# Patient Record
Sex: Female | Born: 1945 | Race: White | Hispanic: No | Marital: Married | Smoking: Never smoker
Health system: Southern US, Community
[De-identification: ages and names within clinical notes are randomized; demographics above are authoritative.]

## PROBLEM LIST (undated history)

## (undated) DIAGNOSIS — I471 Supraventricular tachycardia: Secondary | ICD-10-CM

## (undated) DIAGNOSIS — R002 Palpitations: Secondary | ICD-10-CM

## (undated) DIAGNOSIS — M25569 Pain in unspecified knee: Secondary | ICD-10-CM

## (undated) DIAGNOSIS — U071 COVID-19: Secondary | ICD-10-CM

## (undated) DIAGNOSIS — J309 Allergic rhinitis, unspecified: Secondary | ICD-10-CM

## (undated) DIAGNOSIS — R079 Chest pain, unspecified: Secondary | ICD-10-CM

## (undated) DIAGNOSIS — S42201A Unspecified fracture of upper end of right humerus, initial encounter for closed fracture: Secondary | ICD-10-CM

## (undated) DIAGNOSIS — E785 Hyperlipidemia, unspecified: Secondary | ICD-10-CM

## (undated) DIAGNOSIS — I493 Ventricular premature depolarization: Secondary | ICD-10-CM

## (undated) DIAGNOSIS — I4719 Other supraventricular tachycardia: Secondary | ICD-10-CM

## (undated) DIAGNOSIS — F418 Other specified anxiety disorders: Secondary | ICD-10-CM

## (undated) DIAGNOSIS — G43909 Migraine, unspecified, not intractable, without status migrainosus: Secondary | ICD-10-CM

## (undated) DIAGNOSIS — B353 Tinea pedis: Secondary | ICD-10-CM

## (undated) DIAGNOSIS — M8580 Other specified disorders of bone density and structure, unspecified site: Secondary | ICD-10-CM

## (undated) DIAGNOSIS — I2510 Atherosclerotic heart disease of native coronary artery without angina pectoris: Secondary | ICD-10-CM

## (undated) HISTORY — DX: Other specified disorders of bone density and structure, unspecified site: M85.80

## (undated) HISTORY — PX: OTHER SURGICAL HISTORY: SHX169

## (undated) HISTORY — PX: HAND SURGERY: SHX662

## (undated) HISTORY — DX: Chest pain, unspecified: R07.9

## (undated) HISTORY — DX: Atherosclerotic heart disease of native coronary artery without angina pectoris: I25.10

## (undated) HISTORY — DX: Tinea pedis: B35.3

## (undated) HISTORY — DX: Allergic rhinitis, unspecified: J30.9

## (undated) HISTORY — DX: Supraventricular tachycardia: I47.1

## (undated) HISTORY — DX: Ventricular premature depolarization: I49.3

## (undated) HISTORY — PX: ABDOMINAL HYSTERECTOMY: SHX81

## (undated) HISTORY — DX: COVID-19: U07.1

## (undated) HISTORY — DX: Palpitations: R00.2

## (undated) HISTORY — DX: Pain in unspecified knee: M25.569

## (undated) HISTORY — DX: Other specified anxiety disorders: F41.8

## (undated) HISTORY — PX: CARPAL TUNNEL WITH CUBITAL TUNNEL: SHX5608

## (undated) HISTORY — DX: Other supraventricular tachycardia: I47.19

## (undated) HISTORY — DX: Hyperlipidemia, unspecified: E78.5

## (undated) HISTORY — DX: Migraine, unspecified, not intractable, without status migrainosus: G43.909

---

## 2002-05-03 ENCOUNTER — Ambulatory Visit (HOSPITAL_BASED_OUTPATIENT_CLINIC_OR_DEPARTMENT_OTHER): Admission: RE | Admit: 2002-05-03 | Discharge: 2002-05-03 | Payer: Self-pay | Admitting: Orthopedic Surgery

## 2003-06-28 ENCOUNTER — Ambulatory Visit (HOSPITAL_COMMUNITY): Admission: RE | Admit: 2003-06-28 | Discharge: 2003-06-28 | Payer: Self-pay | Admitting: Internal Medicine

## 2003-06-28 ENCOUNTER — Encounter: Payer: Self-pay | Admitting: Internal Medicine

## 2004-07-04 ENCOUNTER — Ambulatory Visit (HOSPITAL_COMMUNITY): Admission: RE | Admit: 2004-07-04 | Discharge: 2004-07-04 | Payer: Self-pay | Admitting: Internal Medicine

## 2007-04-06 ENCOUNTER — Ambulatory Visit (HOSPITAL_COMMUNITY): Admission: RE | Admit: 2007-04-06 | Discharge: 2007-04-06 | Payer: Self-pay | Admitting: Internal Medicine

## 2007-04-28 ENCOUNTER — Ambulatory Visit (HOSPITAL_COMMUNITY): Admission: RE | Admit: 2007-04-28 | Discharge: 2007-04-28 | Payer: Self-pay | Admitting: Internal Medicine

## 2007-04-29 ENCOUNTER — Ambulatory Visit (HOSPITAL_COMMUNITY): Admission: RE | Admit: 2007-04-29 | Discharge: 2007-04-29 | Payer: Self-pay | Admitting: Internal Medicine

## 2007-05-03 ENCOUNTER — Ambulatory Visit: Payer: Self-pay | Admitting: Cardiology

## 2007-05-27 ENCOUNTER — Ambulatory Visit: Payer: Self-pay | Admitting: Cardiology

## 2007-08-16 ENCOUNTER — Ambulatory Visit (HOSPITAL_COMMUNITY): Admission: RE | Admit: 2007-08-16 | Discharge: 2007-08-16 | Payer: Self-pay | Admitting: Internal Medicine

## 2009-11-22 ENCOUNTER — Ambulatory Visit (HOSPITAL_COMMUNITY): Admission: RE | Admit: 2009-11-22 | Discharge: 2009-11-22 | Payer: Self-pay | Admitting: Internal Medicine

## 2010-06-19 ENCOUNTER — Ambulatory Visit: Payer: Self-pay | Admitting: Vascular Surgery

## 2010-06-19 ENCOUNTER — Ambulatory Visit (HOSPITAL_COMMUNITY): Admission: RE | Admit: 2010-06-19 | Discharge: 2010-06-19 | Payer: Self-pay | Admitting: Orthopaedic Surgery

## 2010-10-10 ENCOUNTER — Ambulatory Visit (HOSPITAL_COMMUNITY)
Admission: RE | Admit: 2010-10-10 | Discharge: 2010-10-10 | Payer: Self-pay | Source: Home / Self Care | Attending: Internal Medicine | Admitting: Internal Medicine

## 2010-12-25 ENCOUNTER — Ambulatory Visit (HOSPITAL_COMMUNITY)
Admission: RE | Admit: 2010-12-25 | Discharge: 2010-12-25 | Disposition: A | Discharge status: Home / Self Care | Payer: 59 | Source: Ambulatory Visit | Attending: Gastroenterology | Admitting: Gastroenterology

## 2010-12-25 DIAGNOSIS — Z1211 Encounter for screening for malignant neoplasm of colon: Secondary | ICD-10-CM | POA: Insufficient documentation

## 2010-12-29 NOTE — Op Note (Signed)
  [name], [name] NO.:  [id number]  MEDICAL RECORD NO.:  [id number]           PATIENT TYPE:  O  LOCATION:  [facility]                         FACILITY:  [facility]  PHYSICIAN:  Danise Edge, M.D.   DATE OF BIRTH:  [birth year]  DATE OF PROCEDURE:  12/25/2010 DATE OF DISCHARGE:                              OPERATIVE REPORT   REFERRING PHYSICIAN:  Thora Lance, M.D.  PROCEDURE:  Screening colonoscopy.  HISTORY:  Ms. Cathy Tucker is a 65 year old female born on 1946-01-27. The patient is scheduled to undergo her second screening colonoscopy with polypectomy to prevent colon cancer.  The patient underwent a normal screening colonoscopy in 2003.  ENDOSCOPIST:  Danise Edge, M.D.  PREMEDICATION:  Propofol administered by Anesthesia.  PROCEDURE IN DETAIL:  After obtaining informed consent, the patient was placed in the left lateral decubitus position.  Anal inspection and digital rectal exam were normal.  The Pentax pediatric colonoscope was introduced into the rectum and advanced to the cecum.  A normal- appearing appendiceal orifice and ileocecal valve were identified. Colonic preparation for the exam today was good.  Advancement of the colonoscope around the colon was technically difficult due to colonic loop formation.  Rectum normal.  Retroflexed view of the distal rectum normal. Sigmoid colon and descending colon normal. Splenic flexure normal. Transverse colon normal. Hepatic flexure normal. Ascending colon normal. Cecum and ileocecal valve normal.  ASSESSMENT:  Normal screening proctocolonoscopy of the cecum.  RECOMMENDATIONS:  Repeat screening colonoscopy in 10 years.          ______________________________ Danise Edge, M.D.     MJ/MEDQ  D:  12/25/2010  T:  12/26/2010  Job:  [id number]  cc:   Thora Lance, M.D. Fax: [phone]  Electronically Signed by Danise Edge M.D. on 12/29/2010 05:04:49 PM

## 2011-03-17 NOTE — Assessment & Plan Note (Signed)
[facility]                            CARDIOLOGY OFFICE NOTE   JOSSELYN, HARKINS                          MRN:          [id number]  DATE:05/03/2007                            DOB:          1946/02/13    PRIMARY CARE PHYSICIAN:  Lovenia Kim, D.O.   REASON FOR REFERRAL:  Patient with dyspnea and lower extremity swelling.   HISTORY OF PRESENT ILLNESS:  The patient is a pleasant 65 year old with  no prior cardiac history.  She has had a stress perfusion study in 2005.  She had no evidence of ischemia or infarction, with a well-preserved  ejection fraction of 76%.  She has done relatively well since that time.  She states she remains active.  She has not exercised as much as she  used to.  She walks on a treadmill occasionally or walks outside.  She  has noticed increasing dyspnea climbing a flight of stairs or two  flights of stairs.  Has to be a fairly moderate to heavy level of  exertion to bring on these complaints.  This has been slowly progressive  over many months or even years.  She has not had any resting shortness  of breath.  She has not had any chest discomfort, neck or arm  discomfort.  She denies any palpitations, pre-syncope or syncope.  She  does not get these complaints with walking on level ground or a slight  incline.  It really has to be up a very significant incline or stairs.  The symptoms go away several minutes after she gets to the top of two or  three flights of stairs.  The patient has also had some increasing lower  extremity swelling.  This seems to be mild but has not been going away.  Does not seem to be less at the beginning of the day.  She has had a  slow weight gain but no dramatic increase in her weight or increased  abdominal girth.   PAST MEDICAL HISTORY:  She has no history of hypertension, diabetes or  hyperlipidemia.   PAST SURGICAL HISTORY:  1. Hysterectomy.  2. Carpal tunnel surgery.  3. A broken  finger repaired.   ALLERGIES:  PENICILLIN AND TETRACYCLINE.   MEDICAL DECISION MAKING:  1. Premarin 1.25 mg daily.  2. Zyrtec.  3. Tagamet 400 mg b.i.d.  4. Hydrochlorothiazide 12.5 mg daily.  5. Red yeast rice.  6. Calcium t.i.d.   SOCIAL HISTORY:  The patient has never really smoked cigarettes.  She is  a widow.  She has two children.  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   FAMILY HISTORY:  Noncontributory for early coronary artery disease.  She  has two sisters with emphysema.  They were heavy smokers.   REVIEW OF SYSTEMS:  Positive for occasional headaches, sinus infection,  peptic ulcer, leg pain and cramping.  Negative for other systems.   PHYSICAL EXAMINATION:  GENERAL:  The patient is in no acute distress.  VITAL SIGNS:  Blood pressure 112/66, heart rate 69 and regular, weight  180 pounds, body mass index 30.  HEENT:  Eyes unremarkable.  Pupils equal, round, reactive to light.  Fundi within normal limits.  Oral mucosa unremarkable.  NECK:  No jugular venous distention at 45 degrees.  Carotid upstroke  brisk and symmetric.  No bruits, no thyromegaly.  LYMPHATICS:  No cervical, axillary or inguinal adenopathy.  LUNGS:  Clear to auscultation bilaterally.  BACK:  No costovertebral angle tenderness.  CHEST:  Unremarkable.  HEART:  PMI not displaced or sustained.  S1 and S2 within normal limits.  No S3 or S4, no clicks, rubs or murmurs.  ABDOMEN:  Obese, positive bowel sounds.  Normal in frequency and pitch.  No bruits, no rebound, no guarding, no midline pulsatile mass, no  hepatomegaly or splenomegaly.  SKIN:  No rashes, no nodules.  EXTREMITIES:  With 2+ pulses throughout.  No clubbing or cyanosis.  Lower extremity edema.  NEUROLOGIC:  Oriented to person, place and time.  Cranial nerves II-XII  grossly intact.  Motor grossly intact.   Electrocardiogram:  Sinus rhythm, rate 57, axis within normal limits.  Intervals within normal limits.  No acute  ST-wave changes.   ASSESSMENT/PLAN:  1. Dyspnea:  The patient's dyspnea is only at extreme exertion or      different activity than she is used to, such as climbing two or      three flights of stairs.  She had a negative stress perfusion study      in 2005.  I think the possibility of high-grade obstructive      coronary disease is low as the etiology.  I do not suspect any      structural heart disease, but she has a slightly quiet precordium.      She does have lower extremity edema.  At this point, I think an      echocardiogram would be very helpful.  If it comes back with no      structural abnormalities, including no occult valve disease, then I      would not suggest further cardiovascular workup.  Rather, I would      prescribe diet and exercise (and I discussed these in detail).  If      her dyspnea did not improve with increased activity or exercise, I      would want to re-evaluate her probably with a cardiopulmonary      stress test.  2. Obesity:  We discussed the fact that her body mass index puts her      in the obese range.  We discussed strategies for weight loss with      diet and exercise.   FOLLOWUP:  I will see the patient back as needed, or based on the  results of her echocardiogram or progression of symptoms.     Rollene Rotunda, MD, [facility]  Electronically Signed    JH/MedQ  DD: 05/03/2007  DT: 05/04/2007  Job #: [id number]   cc:   Lovenia Kim, D.O.

## 2011-03-20 NOTE — Op Note (Signed)
[place]  Patient:    BRINLEIGH, TEW Visit Number: [id number] MRN: [id number]          Service Type: DSU Location: [facility] Attending Physician:  Milly Jakob Dictated by:   Harvie Junior, M.D. Proc. 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  PROCEDURE: 1. Open reduction and internal fixation of fifth metacarpal fracture with    mini-fragment instrumentation. 2. Interfragmentary screw and a five-hole plate with 2.0 hardware.  SURGEON:  Harvie Junior, M.D.  ASSISTANT:  Currie Paris. Thedore Mins., and Katrinka Blazing, P.A. student.  ANESTHESIA:  General.  BRIEF HISTORY:  She is a 65 year old female with a history of doing swing dancing with a fall, and she ultimately suffered a twisting type of fracture of her fifth metacarpal.  She had significant shortening and malrotation.  We talked about the attempts at closed reduction but felt that they would not be stable and ultimately felt that open reduction and internal fixation would be the most appropriate course of action, and she was brought to the operating room for this procedure.  DESCRIPTION OF PROCEDURE:  Patient brought to the operating room and after adequate anesthesia was obtained with a general anesthetic, the patient was placed supine upon the operating table.  The right arm was then prepped and draped in the usual sterile fashion.  Following this, a linear incision was made over the fifth metacarpal and subcutaneous tissues taken down to the level of the fifth metacarpal.  The fracture fragments were identified.  There was a proximal split in the proximal piece, and it was felt that interfragmentary fixation would not alone be amenable.  At this point the two main fracture  fragments were identified and anatomically reduced.  An 8 mm interfragmentary screw was used to hold the fracture aligned with excellent fixation of the two pieces achieved at this time; however, there was a linear crack at the distal portion of the screw.  Fixation was being held well, but it was felt that plate fixation was going to be important at this time.  A five-hole mini-fragment plate was then used with 2-0 hardware, two screws proximally, two screws distal.  Anatomic alignment was achieved of the metacarpal fracture, and this plate was placed in standard AO fashion. Excellent closure was achieved over the plate of the periosteal layer with a 4-0 Vicryl running suture and excellent fixation was achieved of the fracture. At this point the wound was copiously irrigated and suctioned dry.  The skin was closed with a 3-0 Monocryl pull-out suture.  A sterile compressive dressing was applied.  The patient was then taken to the recovery room, where she was noted to be in satisfactory condition.  Estimated blood loss for the procedure was none.  Of note, fluoroscopy was used throughout the case to ensure adequate fixation and alignment of the procedure. Dictated by:   Harvie Junior, M.D. Attending Physician:  Milly Jakob DD:  05/03/02 TD:  05/05/02 Job: [id number]

## 2011-10-02 ENCOUNTER — Other Ambulatory Visit: Payer: Self-pay | Admitting: *Deleted

## 2011-10-02 ENCOUNTER — Ambulatory Visit
Admission: RE | Admit: 2011-10-02 | Discharge: 2011-10-02 | Disposition: A | Discharge status: Home / Self Care | Payer: BC Managed Care – PPO | Source: Ambulatory Visit | Attending: *Deleted | Admitting: *Deleted

## 2011-10-02 DIAGNOSIS — R51 Headache: Secondary | ICD-10-CM

## 2013-08-28 ENCOUNTER — Other Ambulatory Visit (HOSPITAL_COMMUNITY): Payer: Self-pay | Admitting: Internal Medicine

## 2013-08-28 DIAGNOSIS — Z1231 Encounter for screening mammogram for malignant neoplasm of breast: Secondary | ICD-10-CM

## 2013-08-29 ENCOUNTER — Ambulatory Visit (HOSPITAL_COMMUNITY)
Admission: RE | Admit: 2013-08-29 | Discharge: 2013-08-29 | Disposition: A | Discharge status: Home / Self Care | Payer: 59 | Source: Ambulatory Visit | Attending: Internal Medicine | Admitting: Internal Medicine

## 2013-08-29 DIAGNOSIS — Z1231 Encounter for screening mammogram for malignant neoplasm of breast: Secondary | ICD-10-CM | POA: Insufficient documentation

## 2015-11-18 ENCOUNTER — Other Ambulatory Visit: Payer: Self-pay | Admitting: Orthopedic Surgery

## 2015-11-18 DIAGNOSIS — M25511 Pain in right shoulder: Secondary | ICD-10-CM

## 2015-11-20 ENCOUNTER — Other Ambulatory Visit (HOSPITAL_COMMUNITY): Payer: Self-pay | Admitting: Orthopedic Surgery

## 2015-11-20 ENCOUNTER — Inpatient Hospital Stay: Admission: RE | Admit: 2015-11-20 | Payer: Medicare Other | Source: Ambulatory Visit

## 2015-11-20 ENCOUNTER — Other Ambulatory Visit: Payer: Medicare Other

## 2015-11-20 ENCOUNTER — Other Ambulatory Visit: Payer: Self-pay | Admitting: Orthopedic Surgery

## 2015-11-20 DIAGNOSIS — M25511 Pain in right shoulder: Secondary | ICD-10-CM

## 2015-11-21 ENCOUNTER — Encounter (HOSPITAL_BASED_OUTPATIENT_CLINIC_OR_DEPARTMENT_OTHER): Payer: Self-pay | Admitting: *Deleted

## 2015-11-21 ENCOUNTER — Other Ambulatory Visit (HOSPITAL_COMMUNITY): Payer: Self-pay | Admitting: Orthopedic Surgery

## 2015-11-21 ENCOUNTER — Ambulatory Visit (HOSPITAL_COMMUNITY)
Admission: RE | Admit: 2015-11-21 | Discharge: 2015-11-21 | Disposition: A | Discharge status: Home / Self Care | Payer: Managed Care, Other (non HMO) | Source: Ambulatory Visit | Attending: Orthopedic Surgery | Admitting: Orthopedic Surgery

## 2015-11-21 ENCOUNTER — Encounter (HOSPITAL_COMMUNITY): Payer: Self-pay

## 2015-11-21 ENCOUNTER — Other Ambulatory Visit: Payer: Self-pay | Admitting: Orthopedic Surgery

## 2015-11-21 DIAGNOSIS — S42211A Unspecified displaced fracture of surgical neck of right humerus, initial encounter for closed fracture: Secondary | ICD-10-CM | POA: Insufficient documentation

## 2015-11-21 DIAGNOSIS — M25511 Pain in right shoulder: Secondary | ICD-10-CM

## 2015-11-21 DIAGNOSIS — S42301A Unspecified fracture of shaft of humerus, right arm, initial encounter for closed fracture: Secondary | ICD-10-CM | POA: Diagnosis present

## 2015-11-22 ENCOUNTER — Ambulatory Visit (HOSPITAL_BASED_OUTPATIENT_CLINIC_OR_DEPARTMENT_OTHER)
Admission: RE | Admit: 2015-11-22 | Discharge: 2015-11-23 | Disposition: A | Discharge status: Home / Self Care | Payer: Managed Care, Other (non HMO) | Source: Ambulatory Visit | Attending: Orthopedic Surgery | Admitting: Orthopedic Surgery

## 2015-11-22 ENCOUNTER — Ambulatory Visit (HOSPITAL_BASED_OUTPATIENT_CLINIC_OR_DEPARTMENT_OTHER): Payer: Managed Care, Other (non HMO) | Admitting: Anesthesiology

## 2015-11-22 ENCOUNTER — Encounter (HOSPITAL_BASED_OUTPATIENT_CLINIC_OR_DEPARTMENT_OTHER): Payer: Self-pay | Admitting: Anesthesiology

## 2015-11-22 ENCOUNTER — Encounter (HOSPITAL_BASED_OUTPATIENT_CLINIC_OR_DEPARTMENT_OTHER)
Admission: RE | Disposition: A | Discharge status: Home / Self Care | Payer: Self-pay | Source: Ambulatory Visit | Attending: Orthopedic Surgery

## 2015-11-22 ENCOUNTER — Ambulatory Visit (HOSPITAL_COMMUNITY): Payer: Managed Care, Other (non HMO)

## 2015-11-22 DIAGNOSIS — X58XXXA Exposure to other specified factors, initial encounter: Secondary | ICD-10-CM | POA: Insufficient documentation

## 2015-11-22 DIAGNOSIS — Z6830 Body mass index (BMI) 30.0-30.9, adult: Secondary | ICD-10-CM | POA: Insufficient documentation

## 2015-11-22 DIAGNOSIS — T148XXA Other injury of unspecified body region, initial encounter: Secondary | ICD-10-CM

## 2015-11-22 DIAGNOSIS — S42209A Unspecified fracture of upper end of unspecified humerus, initial encounter for closed fracture: Secondary | ICD-10-CM | POA: Diagnosis present

## 2015-11-22 DIAGNOSIS — E669 Obesity, unspecified: Secondary | ICD-10-CM | POA: Insufficient documentation

## 2015-11-22 DIAGNOSIS — S42201A Unspecified fracture of upper end of right humerus, initial encounter for closed fracture: Secondary | ICD-10-CM | POA: Diagnosis present

## 2015-11-22 HISTORY — DX: Unspecified fracture of upper end of right humerus, initial encounter for closed fracture: S42.201A

## 2015-11-22 HISTORY — PX: ORIF HUMERUS FRACTURE: SHX2126

## 2015-11-22 SURGERY — OPEN REDUCTION INTERNAL FIXATION (ORIF) PROXIMAL HUMERUS FRACTURE
Anesthesia: Regional | Site: Arm Upper | Laterality: Right

## 2015-11-22 MED ORDER — SENNA 8.6 MG PO TABS
1.0000 | ORAL_TABLET | Freq: Two times a day (BID) | ORAL | Status: DC
  Administered 2015-11-22: 8.6 mg via ORAL
  Filled 2015-11-22: qty 1

## 2015-11-22 MED ORDER — LACTATED RINGERS IV SOLN
INTRAVENOUS | Status: DC
  Administered 2015-11-22 (×3): via INTRAVENOUS

## 2015-11-22 MED ORDER — DOCUSATE SODIUM 100 MG PO CAPS
100.0000 mg | ORAL_CAPSULE | Freq: Two times a day (BID) | ORAL | Status: DC
  Administered 2015-11-22: 100 mg via ORAL
  Filled 2015-11-22: qty 1

## 2015-11-22 MED ORDER — METHOCARBAMOL 1000 MG/10ML IJ SOLN: 500.0000 mg | Freq: Four times a day (QID) | INTRAVENOUS | Status: DC | PRN

## 2015-11-22 MED ORDER — ONDANSETRON HCL 4 MG/2ML IJ SOLN
INTRAMUSCULAR | Status: AC
  Filled 2015-11-22: qty 2

## 2015-11-22 MED ORDER — PROPOFOL 10 MG/ML IV BOLUS
INTRAVENOUS | Status: AC
  Filled 2015-11-22: qty 20

## 2015-11-22 MED ORDER — OXYCODONE HCL 5 MG PO TABS
5.0000 mg | ORAL_TABLET | ORAL | Status: DC | PRN
  Administered 2015-11-23: 10 mg via ORAL
  Filled 2015-11-22: qty 2

## 2015-11-22 MED ORDER — FENTANYL CITRATE (PF) 100 MCG/2ML IJ SOLN
50.0000 ug | INTRAMUSCULAR | Status: DC | PRN
  Administered 2015-11-22: 100 ug via INTRAVENOUS

## 2015-11-22 MED ORDER — CEFAZOLIN SODIUM-DEXTROSE 2-3 GM-% IV SOLR
INTRAVENOUS | Status: AC
  Filled 2015-11-22: qty 50

## 2015-11-22 MED ORDER — PROPOFOL 10 MG/ML IV BOLUS
INTRAVENOUS | Status: DC | PRN
  Administered 2015-11-22: 200 mg via INTRAVENOUS

## 2015-11-22 MED ORDER — MIDAZOLAM HCL 2 MG/2ML IJ SOLN
1.0000 mg | INTRAMUSCULAR | Status: DC | PRN
  Administered 2015-11-22: 2 mg via INTRAVENOUS

## 2015-11-22 MED ORDER — METOCLOPRAMIDE HCL 5 MG/ML IJ SOLN: 5.0000 mg | Freq: Three times a day (TID) | INTRAMUSCULAR | Status: DC | PRN

## 2015-11-22 MED ORDER — ZOLPIDEM TARTRATE 5 MG PO TABS
5.0000 mg | ORAL_TABLET | Freq: Every evening | ORAL | Status: DC | PRN
  Administered 2015-11-23: 5 mg via ORAL
  Filled 2015-11-22: qty 1

## 2015-11-22 MED ORDER — HYDROMORPHONE HCL 1 MG/ML IJ SOLN: 0.5000 mg | INTRAMUSCULAR | Status: DC | PRN

## 2015-11-22 MED ORDER — LIDOCAINE HCL (CARDIAC) 20 MG/ML IV SOLN
INTRAVENOUS | Status: DC | PRN
  Administered 2015-11-22: 50 mg via INTRAVENOUS

## 2015-11-22 MED ORDER — MIDAZOLAM HCL 2 MG/2ML IJ SOLN
INTRAMUSCULAR | Status: AC
  Filled 2015-11-22: qty 2

## 2015-11-22 MED ORDER — PHENYLEPHRINE HCL 10 MG/ML IJ SOLN
INTRAMUSCULAR | Status: DC | PRN
  Administered 2015-11-22 (×4): 40 ug via INTRAVENOUS

## 2015-11-22 MED ORDER — SENNA-DOCUSATE SODIUM 8.6-50 MG PO TABS: 2.0000 | ORAL_TABLET | Freq: Every day | ORAL | Status: DC

## 2015-11-22 MED ORDER — LORATADINE 10 MG PO TABS: 10.0000 mg | ORAL_TABLET | Freq: Every day | ORAL | Status: DC

## 2015-11-22 MED ORDER — SUCCINYLCHOLINE CHLORIDE 20 MG/ML IJ SOLN
INTRAMUSCULAR | Status: DC | PRN
  Administered 2015-11-22: 50 mg via INTRAVENOUS

## 2015-11-22 MED ORDER — OXYCODONE-ACETAMINOPHEN 5-325 MG PO TABS
1.0000 | ORAL_TABLET | ORAL | Status: DC | PRN
  Administered 2015-11-22 – 2015-11-23 (×2): 1 via ORAL
  Filled 2015-11-22 (×2): qty 1

## 2015-11-22 MED ORDER — MIDAZOLAM HCL 5 MG/5ML IJ SOLN
INTRAMUSCULAR | Status: DC | PRN
  Administered 2015-11-22: 2 mg via INTRAVENOUS

## 2015-11-22 MED ORDER — DEXAMETHASONE SODIUM PHOSPHATE 4 MG/ML IJ SOLN
INTRAMUSCULAR | Status: DC | PRN
  Administered 2015-11-22: 10 mg via INTRAVENOUS

## 2015-11-22 MED ORDER — LIDOCAINE HCL (CARDIAC) 20 MG/ML IV SOLN
INTRAVENOUS | Status: AC
  Filled 2015-11-22: qty 5

## 2015-11-22 MED ORDER — FENTANYL CITRATE (PF) 100 MCG/2ML IJ SOLN
INTRAMUSCULAR | Status: DC | PRN
  Administered 2015-11-22 (×2): 50 ug via INTRAVENOUS

## 2015-11-22 MED ORDER — OXYCODONE HCL 5 MG PO TABS: 5.0000 mg | ORAL_TABLET | Freq: Once | ORAL | Status: DC | PRN

## 2015-11-22 MED ORDER — GLYCOPYRROLATE 0.2 MG/ML IJ SOLN: 0.2000 mg | Freq: Once | INTRAMUSCULAR | Status: DC | PRN

## 2015-11-22 MED ORDER — METOCLOPRAMIDE HCL 5 MG PO TABS: 5.0000 mg | ORAL_TABLET | Freq: Three times a day (TID) | ORAL | Status: DC | PRN

## 2015-11-22 MED ORDER — HYDROMORPHONE HCL 1 MG/ML IJ SOLN: 0.2500 mg | INTRAMUSCULAR | Status: DC | PRN

## 2015-11-22 MED ORDER — FENTANYL CITRATE (PF) 100 MCG/2ML IJ SOLN
INTRAMUSCULAR | Status: AC
  Filled 2015-11-22: qty 2

## 2015-11-22 MED ORDER — CEFAZOLIN SODIUM 1-5 GM-% IV SOLN
1.0000 g | Freq: Four times a day (QID) | INTRAVENOUS | Status: AC
  Administered 2015-11-22 – 2015-11-23 (×3): 1 g via INTRAVENOUS
  Filled 2015-11-22 (×3): qty 50

## 2015-11-22 MED ORDER — PROPOFOL 10 MG/ML IV BOLUS: INTRAVENOUS | Status: DC | PRN

## 2015-11-22 MED ORDER — OXYCODONE-ACETAMINOPHEN 10-325 MG PO TABS: 1.0000 | ORAL_TABLET | Freq: Four times a day (QID) | ORAL | Status: DC | PRN

## 2015-11-22 MED ORDER — CEFAZOLIN SODIUM-DEXTROSE 2-3 GM-% IV SOLR
2.0000 g | INTRAVENOUS | Status: AC
  Administered 2015-11-22: 2 g via INTRAVENOUS

## 2015-11-22 MED ORDER — ONDANSETRON HCL 4 MG PO TABS: 4.0000 mg | ORAL_TABLET | Freq: Three times a day (TID) | ORAL | Status: DC | PRN

## 2015-11-22 MED ORDER — BACLOFEN 10 MG PO TABS: 10.0000 mg | ORAL_TABLET | Freq: Three times a day (TID) | ORAL | Status: DC

## 2015-11-22 MED ORDER — POLYETHYLENE GLYCOL 3350 17 G PO PACK: 17.0000 g | PACK | Freq: Every day | ORAL | Status: DC | PRN

## 2015-11-22 MED ORDER — DIAZEPAM 5 MG PO TABS: 5.0000 mg | ORAL_TABLET | Freq: Four times a day (QID) | ORAL | Status: DC | PRN

## 2015-11-22 MED ORDER — OXYCODONE HCL 5 MG/5ML PO SOLN: 5.0000 mg | Freq: Once | ORAL | Status: DC | PRN

## 2015-11-22 MED ORDER — DEXAMETHASONE SODIUM PHOSPHATE 10 MG/ML IJ SOLN
INTRAMUSCULAR | Status: AC
  Filled 2015-11-22: qty 1

## 2015-11-22 MED ORDER — BISACODYL 10 MG RE SUPP: 10.0000 mg | Freq: Every day | RECTAL | Status: DC | PRN

## 2015-11-22 MED ORDER — BUPIVACAINE-EPINEPHRINE (PF) 0.5% -1:200000 IJ SOLN
INTRAMUSCULAR | Status: DC | PRN
  Administered 2015-11-22: 30 mL via PERINEURAL

## 2015-11-22 MED ORDER — ONDANSETRON HCL 4 MG/2ML IJ SOLN: 4.0000 mg | Freq: Four times a day (QID) | INTRAMUSCULAR | Status: DC | PRN

## 2015-11-22 MED ORDER — SCOPOLAMINE 1 MG/3DAYS TD PT72: 1.0000 | MEDICATED_PATCH | Freq: Once | TRANSDERMAL | Status: DC | PRN

## 2015-11-22 MED ORDER — ONDANSETRON HCL 4 MG/2ML IJ SOLN
INTRAMUSCULAR | Status: DC | PRN
  Administered 2015-11-22: 4 mg via INTRAVENOUS

## 2015-11-22 MED ORDER — PHENYLEPHRINE 40 MCG/ML (10ML) SYRINGE FOR IV PUSH (FOR BLOOD PRESSURE SUPPORT)
PREFILLED_SYRINGE | INTRAVENOUS | Status: AC
  Filled 2015-11-22: qty 10

## 2015-11-22 MED ORDER — ONDANSETRON HCL 4 MG PO TABS: 4.0000 mg | ORAL_TABLET | Freq: Four times a day (QID) | ORAL | Status: DC | PRN

## 2015-11-22 MED ORDER — SODIUM CHLORIDE 0.9 % IV SOLN
INTRAVENOUS | Status: DC
  Administered 2015-11-22: 18:00:00 via INTRAVENOUS

## 2015-11-22 MED ORDER — SUCCINYLCHOLINE CHLORIDE 20 MG/ML IJ SOLN
INTRAMUSCULAR | Status: AC
  Filled 2015-11-22: qty 1

## 2015-11-22 MED ORDER — METHOCARBAMOL 500 MG PO TABS
500.0000 mg | ORAL_TABLET | Freq: Four times a day (QID) | ORAL | Status: DC | PRN
  Administered 2015-11-23 (×2): 500 mg via ORAL
  Filled 2015-11-22 (×2): qty 1

## 2015-11-22 MED ORDER — MAGNESIUM CITRATE PO SOLN
1.0000 | Freq: Once | ORAL | Status: DC | PRN
Start: 2015-11-22 — End: 2015-11-23

## 2015-11-22 SURGICAL SUPPLY — 74 items
BIT DRILL 3.2 (BIT) ×4
BIT DRILL 3.2XCALB NS DISP (BIT) IMPLANT
BIT DRILL CALIBRATED 2.7 (BIT) ×1 IMPLANT
BIT DRL 3.2XCALB NS DISP (BIT) ×2
BLADE SURG 10 STRL SS (BLADE) ×2 IMPLANT
BLADE SURG 15 STRL LF DISP TIS (BLADE) ×1 IMPLANT
BLADE SURG 15 STRL SS (BLADE) ×2
CLSR STERI-STRIP ANTIMIC 1/2X4 (GAUZE/BANDAGES/DRESSINGS) ×1 IMPLANT
DECANTER SPIKE VIAL GLASS SM (MISCELLANEOUS) IMPLANT
DRAPE C-ARM 42X72 X-RAY (DRAPES) ×2 IMPLANT
DRAPE INCISE IOBAN 66X45 STRL (DRAPES) IMPLANT
DRAPE SURG 17X23 STRL (DRAPES) IMPLANT
DRAPE U 20/CS (DRAPES) ×3 IMPLANT
DRAPE U-SHAPE 47X51 STRL (DRAPES) ×3 IMPLANT
DRAPE U-SHAPE 76X120 STRL (DRAPES) ×4 IMPLANT
DRSG MEPILEX BORDER 4X8 (GAUZE/BANDAGES/DRESSINGS) ×2 IMPLANT
DURAPREP 26ML APPLICATOR (WOUND CARE) ×2 IMPLANT
ELECT REM PT RETURN 9FT ADLT (ELECTROSURGICAL) ×2
ELECTRODE REM PT RTRN 9FT ADLT (ELECTROSURGICAL) ×1 IMPLANT
GAUZE SPONGE 4X4 12PLY STRL (GAUZE/BANDAGES/DRESSINGS) ×2 IMPLANT
GAUZE SPONGE 4X4 16PLY XRAY LF (GAUZE/BANDAGES/DRESSINGS) IMPLANT
GAUZE XEROFORM 1X8 LF (GAUZE/BANDAGES/DRESSINGS) IMPLANT
GLOVE BIO SURGEON STRL SZ7 (GLOVE) ×1 IMPLANT
GLOVE BIO SURGEON STRL SZ8 (GLOVE) ×3 IMPLANT
GLOVE BIOGEL PI IND STRL 7.0 (GLOVE) IMPLANT
GLOVE BIOGEL PI IND STRL 8 (GLOVE) ×2 IMPLANT
GLOVE BIOGEL PI INDICATOR 7.0 (GLOVE) ×2
GLOVE BIOGEL PI INDICATOR 8 (GLOVE) ×2
GLOVE ECLIPSE 6.5 STRL STRAW (GLOVE) ×1 IMPLANT
GLOVE ORTHO TXT STRL SZ7.5 (GLOVE) ×2 IMPLANT
GOWN STRL REUS W/ TWL LRG LVL3 (GOWN DISPOSABLE) ×1 IMPLANT
GOWN STRL REUS W/ TWL XL LVL3 (GOWN DISPOSABLE) ×2 IMPLANT
GOWN STRL REUS W/TWL LRG LVL3 (GOWN DISPOSABLE) ×2
GOWN STRL REUS W/TWL XL LVL3 (GOWN DISPOSABLE) ×4
K-WIRE 2X5 SS THRDED S3 (WIRE) ×8
KWIRE 2X5 SS THRDED S3 (WIRE) IMPLANT
NS IRRIG 1000ML POUR BTL (IV SOLUTION) ×2 IMPLANT
PACK ARTHROSCOPY DSU (CUSTOM PROCEDURE TRAY) ×2 IMPLANT
PACK BASIN DAY SURGERY FS (CUSTOM PROCEDURE TRAY) ×2 IMPLANT
PEG LOCKING 3.2MMX44 (Peg) ×1 IMPLANT
PEG LOCKING 3.2X34 (Screw) ×2 IMPLANT
PEG LOCKING 3.2X36 (Screw) ×2 IMPLANT
PEG LOCKING 3.2X40 (Peg) ×1 IMPLANT
PENCIL BUTTON HOLSTER BLD 10FT (ELECTRODE) ×2 IMPLANT
PLATE PROX HUM LO R 7H 133 (Plate) ×2 IMPLANT
SCREW LOCK CORT STAR 3.5X22 (Screw) ×1 IMPLANT
SCREW LP NL T15 3.5X26 (Screw) ×4 IMPLANT
SCREW PEG LOCK 3.2X30MM (Screw) ×1 IMPLANT
SLEEVE MEASURING 3.2 (BIT) ×1 IMPLANT
SLEEVE SCD COMPRESS KNEE MED (MISCELLANEOUS) ×2 IMPLANT
SLING ARM FOAM STRAP LRG (SOFTGOODS) IMPLANT
SLING ARM IMMOBILIZER LRG (SOFTGOODS) IMPLANT
SLING ARM IMMOBILIZER MED (SOFTGOODS) ×1 IMPLANT
SLING ARM MED ADULT FOAM STRAP (SOFTGOODS) IMPLANT
SLING ARM XL FOAM STRAP (SOFTGOODS) IMPLANT
SPONGE LAP 18X18 X RAY DECT (DISPOSABLE) ×4 IMPLANT
SUCTION FRAZIER HANDLE 10FR (MISCELLANEOUS) ×1
SUCTION TUBE FRAZIER 10FR DISP (MISCELLANEOUS) ×1 IMPLANT
SUPPORT WRAP ARM LG (MISCELLANEOUS) IMPLANT
SUT FIBERWIRE #2 38 T-5 BLUE (SUTURE) ×4
SUT MNCRL AB 4-0 PS2 18 (SUTURE) IMPLANT
SUT VIC AB 0 CT1 18XCR BRD 8 (SUTURE) IMPLANT
SUT VIC AB 0 CT1 27 (SUTURE) ×2
SUT VIC AB 0 CT1 27XBRD ANBCTR (SUTURE) IMPLANT
SUT VIC AB 0 CT1 8-18 (SUTURE)
SUT VIC AB 2-0 SH 27 (SUTURE)
SUT VIC AB 2-0 SH 27XBRD (SUTURE) IMPLANT
SUT VICRYL 3-0 CR8 SH (SUTURE) ×3 IMPLANT
SUTURE FIBERWR #2 38 T-5 BLUE (SUTURE) IMPLANT
SYR BULB IRRIGATION 50ML (SYRINGE) ×2 IMPLANT
TAPE STRIPS DRAPE STRL (GAUZE/BANDAGES/DRESSINGS) IMPLANT
TOWEL OR 17X24 6PK STRL BLUE (TOWEL DISPOSABLE) ×2 IMPLANT
TOWEL OR NON WOVEN STRL DISP B (DISPOSABLE) ×2 IMPLANT
YANKAUER SUCT BULB TIP NO VENT (SUCTIONS) ×1 IMPLANT

## 2015-11-22 NOTE — Anesthesia Preprocedure Evaluation (Signed)
Anesthesia Evaluation  Patient identified by MRN, date of birth, ID band Patient awake    Reviewed: Allergy & Precautions, NPO status , Patient's Chart, lab work & pertinent test results  Airway Mallampati: II   Neck ROM: full    Dental   Pulmonary neg pulmonary ROS,    breath sounds clear to auscultation       Cardiovascular negative cardio ROS   Rhythm:regular Rate:Normal     Neuro/Psych    GI/Hepatic   Endo/Other  obese  Renal/GU      Musculoskeletal   Abdominal   Peds  Hematology   Anesthesia Other Findings   Reproductive/Obstetrics                             Anesthesia Physical Anesthesia Plan  ASA: II  Anesthesia Plan: General and Regional   Post-op Pain Management: MAC Combined w/ Regional for Post-op pain   Induction: Intravenous  Airway Management Planned: Oral ETT  Additional Equipment:   Intra-op Plan:   Post-operative Plan: Extubation in OR  Informed Consent: I have reviewed the patients History and Physical, chart, labs and discussed the procedure including the risks, benefits and alternatives for the proposed anesthesia with the patient or authorized representative who has indicated his/her understanding and acceptance.     Plan Discussed with: CRNA, Anesthesiologist and Surgeon  Anesthesia Plan Comments:         Anesthesia Quick Evaluation

## 2015-11-22 NOTE — Op Note (Signed)
11/22/2015  3:38 PM  PATIENT:  Cathy Tucker    PRE-OPERATIVE DIAGNOSIS:  RIGHT PROXIMAL HUMERUS FRACTURE  POST-OPERATIVE DIAGNOSIS:  Same  PROCEDURE:  OPEN REDUCTION INTERNAL FIXATION (ORIF) RIGHT PROXIMAL HUMERUS FRACTURE  SURGEON:  Johnny Bridge, MD  PHYSICIAN ASSISTANT: Joya Gaskins, OPA-C, present and scrubbed throughout the case, critical for completion in a timely fashion, and for retraction, instrumentation, and closure.  ANESTHESIA:   General  PREOPERATIVE INDICATIONS:  LARENE Tucker is a  70 y.o. female with a diagnosis of RIGHT PROXIMAL HUMERUS FRACTURE who elected for surgical management.    The risks benefits and alternatives were discussed with the patient including but not limited to the risks of nonoperative treatment, versus surgical intervention including infection, bleeding, nerve injury, malunion, nonunion, the need for revision surgery, hardware prominence, hardware failure, the need for hardware removal, blood clots, cardiopulmonary complications, conversion to arthroplasty, morbidity, mortality, among others, and they were willing to proceed.  Predicted outcome is good, although there will be at least a six to nine month expected recovery.   OPERATIVE IMPLANTS: Biomet ALPS proximal humerus locking plate extended length with multiple proximal locking smooth pegs, and a total of 3 distal bicortical screws and one distal locking bicortical screw. I also placed a #2 FiberWire through the cuff of the supraspinatus and subscapularis. These were brought down into the plate.  OPERATIVE FINDINGS: Displaced proximal humerus fracture.  UNIQUE ASPECTS OF THE CASE:  There was a fairly large medial spike of bone which was a separate butterfly fragment which was anterior. It could not be accessed through the plate, and remained stable throughout the case, such that I did not feel that additional fixation into the medial aspect of the proximal humerus was indicated, as the risks would  outweigh the benefits. It was completely covered and soft tissue from the pectoralis, and not accessible. The fracture did extend fairly distal, requiring the extended length plate that had the precontoured curvature for the deltoid.  OPERATIVE PROCEDURE: The patient was brought to the operating room and placed in the supine position. General anesthesia was administered. IV antibiotics were given. She was placed in the beach chair position. All bony prominences were padded. The upper extremity was prepped and draped in usual sterile fashion. Deltopectoral incision was performed.  I exposed the fracture site, and placed deep retractors. I did not tenotomize the biceps tendon.  I placed supraspinatus and subscapularis stitches, and then reduced the head onto the shaft. This was maintained in satisfactory position. Initially I started with the smaller plate, but when I checked the x-ray I appreciated that the fracture actually extended extremely distal, and I felt that an extended length plate was more indicated. Therefore I extended my incision distally, and switched plates, and secured the plate initially with nonlocking screws distally and K wires proximally. I had to walk the plate down to the bone, as it was still slightly interposed over the deltoid, despite the bend in the plate.   I confirmed position of the reduction and the plate with C-arm, and I placed a total of 2 guidewires into the appropriate position in the head. I was satisfied that the plate was distal appropriately, and then secured the plate proximally with smooth pegs, taking care to prevent penetration into the arch articular surface, using C-arm, as well as manual feel using a hand drill.  I then secured the plate distally.  The plate was slightly posterior distally, such that the middle screw in the cluster of  distal holes was slightly posterior. I used a nonlocking screw initially, and then subsequently switched for a locking screw  shortening it after I had all of the fixation in place, because the screw was effectively hugging the posterior cortex.  I then passed the FiberWire sutures from the subscapularis and supraspinatus through the plate and secured the tuberosities. Once complete fixation and reduction of been achieved, took final C-arm pictures, and irrigated the wounds copiously, and repaired the deltopectoral interval with Vicryl followed by Vicryl for the subcutaneous tissue with Monocryl and Steri-Strips for the skin. She was placed in a sling. She had a preoperative regional block as well. She tolerated the procedure well with no complications.

## 2015-11-22 NOTE — Transfer of Care (Signed)
Immediate Anesthesia Transfer of Care Note  Patient: Cathy Tucker  Procedure(s) Performed: Procedure(s): OPEN REDUCTION INTERNAL FIXATION (ORIF) RIGHT PROXIMAL HUMERUS FRACTURE (Right)  Patient Location: PACU  Anesthesia Type:GA combined with regional for post-op pain  Level of Consciousness: awake, sedated and responds to stimulation  Airway & Oxygen Therapy: Patient Spontanous Breathing and Patient connected to face mask oxygen  Post-op Assessment: Report given to RN, Post -op Vital signs reviewed and stable and Patient moving all extremities  Post vital signs: Reviewed and stable  Last Vitals:  Filed Vitals:   11/22/15 1300 11/22/15 1301  BP: 128/105   Pulse: 100 114  Temp:    Resp: 15 20    Complications: No apparent anesthesia complications

## 2015-11-22 NOTE — Anesthesia Procedure Notes (Addendum)
Anesthesia Regional Block:  Interscalene brachial plexus block  Pre-Anesthetic Checklist: ,, timeout performed, Correct Patient, Correct Site, Correct Laterality, Correct Procedure, Correct Position, site marked, Risks and benefits discussed,  Surgical consent,  Pre-op evaluation,  At surgeon's request and post-op pain management  Laterality: Right  Prep: chloraprep       Needles:  Injection technique: Single-shot  Needle Type: Echogenic Stimulator Needle     Needle Length: 5cm 5 cm Needle Gauge: 22 and 22 G    Additional Needles:  Procedures: ultrasound guided (picture in chart) and nerve stimulator Interscalene brachial plexus block  Nerve Stimulator or Paresthesia:  Response: biceps flexion, 0.45 mA,   Additional Responses:   Narrative:  Start time: 11/22/2015 12:50 PM End time: 11/22/2015 12:59 PM Injection made incrementally with aspirations every 5 mL.  Performed by: Personally  Anesthesiologist: HODIERNE, ADAM  Additional Notes: Functioning IV was confirmed and monitors were applied.  A 25mm 22ga Arrow echogenic stimulator needle was used. Sterile prep and drape,hand hygiene and sterile gloves were used.  Negative aspiration and negative test dose prior to incremental administration of local anesthetic. The patient tolerated the procedure well.  Ultrasound guidance: relevent anatomy identified, needle position confirmed, local anesthetic spread visualized around nerve(s), vascular puncture avoided.  Image printed for medical record.    Procedure Name: Intubation Date/Time: 11/22/2015 12:16 PM Performed by: Toula Moos L Pre-anesthesia Checklist: Patient identified, Emergency Drugs available, Suction available, Patient being monitored and Timeout performed Patient Re-evaluated:Patient Re-evaluated prior to inductionOxygen Delivery Method: Circle System Utilized Preoxygenation: Pre-oxygenation with 100% oxygen Intubation Type: IV induction Ventilation: Mask  ventilation without difficulty Laryngoscope Size: Miller and 3 Grade View: Grade II Tube type: Oral Number of attempts: 1 Airway Equipment and Method: Stylet and Oral airway Placement Confirmation: ETT inserted through vocal cords under direct vision,  positive ETCO2 and breath sounds checked- equal and bilateral Secured at: 22 cm Tube secured with: Tape Dental Injury: Teeth and Oropharynx as per pre-operative assessment

## 2015-11-22 NOTE — H&P (Signed)
PREOPERATIVE H&P  Chief Complaint: RIGHT PROXIMAL HUMERUS FRACTURE  HPI: Cathy Tucker is a 70 y.o. female who presents for preoperative history and physical with a diagnosis of RIGHT PROXIMAL HUMERUS FRACTURE. Symptoms are rated as moderate to severe, and have been worsening.  This is significantly impairing activities of daily living.  She has elected for surgical management. This occurred approximately one week ago, and she gets severe pain with movement, better pain relief with rest as well as Percocet. Pain is located over the right shoulder. Initially had some pain over the elbow as well, x-rays were negative, as gradually improved, but still has persistent symptoms over her shoulder.  History reviewed. No pertinent past medical history. Past Surgical History  Procedure Laterality Date  . Abdominal hysterectomy    . Carpal tunnel with cubital tunnel    . Hand surgery     Social History   Social History  . Marital Status: Widowed    Spouse Name: N/A  . Number of Children: N/A  . Years of Education: N/A   Social History Main Topics  . Smoking status: Never Smoker   . Smokeless tobacco: Never Used  . Alcohol Use: No  . Drug Use: No  . Sexual Activity: Not Asked   Other Topics Concern  . None   Social History Narrative   History reviewed. No pertinent family history. Allergies  Allergen Reactions  . Tetracyclines & Related Hives  . Penicillins Rash   Prior to Admission medications   Medication Sig Start Date End Date Taking? Authorizing Provider  BIOGAIA PROBIOTIC (BIOGAIA PROBIOTIC) LIQD Take by mouth daily at 8 pm.   Yes Historical Provider, MD  diazepam (VALIUM) 5 MG tablet Take 5 mg by mouth every 6 (six) hours as needed for anxiety.   Yes Historical Provider, MD  fexofenadine-pseudoephedrine (ALLEGRA-D 24) 180-240 MG 24 hr tablet Take 1 tablet by mouth daily.   Yes Historical Provider, MD  oxycodone (OXY-IR) 5 MG capsule Take 5 mg by mouth every 4 (four) hours as  needed.   Yes Historical Provider, MD     Positive ROS: All other systems have been reviewed and were otherwise negative with the exception of those mentioned in the HPI and as above.  Physical Exam: General: Alert, no acute distress Cardiovascular: No pedal edema Respiratory: No cyanosis, no use of accessory musculature GI: No organomegaly, abdomen is soft and non-tender Skin: No lesions in the area of chief complaint Neurologic: Sensation intact distally Psychiatric: Patient is competent for consent with normal mood and affect Lymphatic: No axillary or cervical lymphadenopathy  MUSCULOSKELETAL: Right shoulder has ecchymosis and swelling and all fingers flex extend and abduct and sensation intact throughout the fingers and wrist.   X-rays and CAT scan demonstrated evidence for a displaced proximal humerus fracture.  Assessment: RIGHT PROXIMAL HUMERUS FRACTURE   Plan: Plan for Procedure(s): OPEN REDUCTION INTERNAL FIXATION (ORIF) RIGHT PROXIMAL HUMERUS FRACTURE   The risks benefits and alternatives were discussed with the patient including but not limited to the risks of nonoperative treatment, versus surgical intervention including infection, bleeding, nerve injury, malunion, nonunion, the need for revision surgery, hardware prominence, hardware failure, the need for hardware removal, blood clots, cardiopulmonary complications, morbidity, mortality, among others, and they were willing to proceed.     Johnny Bridge, MD Cell [phone]   11/22/2015 7:34 AM

## 2015-11-22 NOTE — Anesthesia Postprocedure Evaluation (Signed)
Anesthesia Post Note  Patient: Cathy Tucker  Procedure(s) Performed: Procedure(s) (LRB): OPEN REDUCTION INTERNAL FIXATION (ORIF) RIGHT PROXIMAL HUMERUS FRACTURE (Right)  Patient location during evaluation: PACU Anesthesia Type: General and Regional Level of consciousness: awake and alert Pain management: satisfactory to patient Vital Signs Assessment: post-procedure vital signs reviewed and stable Respiratory status: spontaneous breathing, nonlabored ventilation, respiratory function stable and patient connected to nasal cannula oxygen Cardiovascular status: blood pressure returned to baseline and stable Postop Assessment: no signs of nausea or vomiting Anesthetic complications: no    Last Vitals:  Filed Vitals:   11/22/15 1630 11/22/15 1645  BP: 125/66 124/69  Pulse: 90 95  Temp:    Resp: 15 13    Last Pain:  Filed Vitals:   11/22/15 1648  PainSc: 0-No pain                 Pacey Willadsen,JAMES TERRILL

## 2015-11-22 NOTE — Discharge Instructions (Signed)
Diet: As you were doing prior to hospitalization  ° °Shower:  May shower but keep the wounds dry, use an occlusive plastic wrap, NO SOAKING IN TUB.  If the bandage gets wet, change with a clean dry gauze.  If you have a splint on, leave the splint in place and keep the splint dry with a plastic bag. ° °Dressing:  You may change your dressing 3-5 days after surgery, unless you have a splint.  If you have a splint, then just leave the splint in place and we will change your bandages during your first follow-up appointment.   ° °If you had hand or foot surgery, we will plan to remove your stitches in about 2 weeks in the office.  For all other surgeries, there are sticky tapes (steri-strips) on your wounds and all the stitches are absorbable.  Leave the steri-strips in place when changing your dressings, they will peel off with time, usually 2-3 weeks. ° °Activity:  Increase activity slowly as tolerated, but follow the weight bearing instructions below.  The rules on driving is that you can not be taking narcotics while you drive, and you must feel in control of the vehicle.   ° °Weight Bearing:   Sling at all times..   ° °To prevent constipation: you may use a stool softener such as - ° °Colace (over the counter) 100 mg by mouth twice a day  °Drink plenty of fluids (prune juice may be helpful) and high fiber foods °Miralax (over the counter) for constipation as needed.   ° °Itching:  If you experience itching with your medications, try taking only a single pain pill, or even half a pain pill at a time.  You may take up to 10 pain pills per day, and you can also use benadryl over the counter for itching or also to help with sleep.  ° °Precautions:  If you experience chest pain or shortness of breath - call 911 immediately for transfer to the hospital emergency department!! ° °If you develop a fever greater that 101 F, purulent drainage from wound, increased redness or drainage from wound, or calf pain -- Call the  office at [phone]                                                °Follow- Up Appointment:  Please call for an appointment to be seen in 2 weeks [facility] - [phone] ° °Regional Anesthesia Blocks ° °1. Numbness or the inability to move the "blocked" extremity may last from 3-48 hours after placement. The length of time depends on the medication injected and your individual response to the medication. If the numbness is not going away after 48 hours, call your surgeon. ° °2. The extremity that is blocked will need to be protected until the numbness is gone and the  Strength has returned. Because you cannot feel it, you will need to take extra care to avoid injury. Because it may be weak, you may have difficulty moving it or using it. You may not know what position it is in without looking at it while the block is in effect. ° °3. For blocks in the legs and feet, returning to weight bearing and walking needs to be done carefully. You will need to wait until the numbness is entirely gone and the strength has returned. You should be   able to move your leg and foot normally before you try and bear weight or walk. You will need someone to be with you when you first try to ensure you do not fall and possibly risk injury. ° °4. Bruising and tenderness at the needle site are common side effects and will resolve in a few days. ° °5. Persistent numbness or new problems with movement should be communicated to the surgeon or the [facility] ([phone])/ [facility] ([phone]). ° °Post Anesthesia Home Care Instructions ° °Activity: °Get plenty of rest for the remainder of the day. A responsible adult should stay with you for 24 hours following the procedure.  °For the next 24 hours, DO NOT: °-Drive a car °-Operate machinery °-Drink alcoholic beverages °-Take any medication unless instructed by your physician °-Make any legal decisions or sign important papers. ° °Meals: °Start with liquid  foods such as gelatin or soup. Progress to regular foods as tolerated. Avoid greasy, spicy, heavy foods. If nausea and/or vomiting occur, drink only clear liquids until the nausea and/or vomiting subsides. Call your physician if vomiting continues. ° °Special Instructions/Symptoms: °Your throat may feel dry or sore from the anesthesia or the breathing tube placed in your throat during surgery. If this causes discomfort, gargle with warm salt water. The discomfort should disappear within 24 hours. ° °If you had a scopolamine patch placed behind your ear for the management of post- operative nausea and/or vomiting: ° °1. The medication in the patch is effective for 72 hours, after which it should be removed.  Wrap patch in a tissue and discard in the trash. Wash hands thoroughly with soap and water. °2. You may remove the patch earlier than 72 hours if you experience unpleasant side effects which may include dry mouth, dizziness or visual disturbances. °3. Avoid touching the patch. Wash your hands with soap and water after contact with the patch. °  ° ° ° ° °

## 2015-11-23 DIAGNOSIS — S42201A Unspecified fracture of upper end of right humerus, initial encounter for closed fracture: Secondary | ICD-10-CM | POA: Diagnosis not present

## 2015-11-25 ENCOUNTER — Other Ambulatory Visit: Payer: Medicare Other

## 2015-11-25 ENCOUNTER — Encounter (HOSPITAL_BASED_OUTPATIENT_CLINIC_OR_DEPARTMENT_OTHER): Payer: Self-pay | Admitting: Orthopedic Surgery

## 2016-01-07 ENCOUNTER — Ambulatory Visit: Payer: Managed Care, Other (non HMO) | Attending: Orthopedic Surgery

## 2016-01-07 DIAGNOSIS — R29898 Other symptoms and signs involving the musculoskeletal system: Secondary | ICD-10-CM

## 2016-01-07 DIAGNOSIS — M25611 Stiffness of right shoulder, not elsewhere classified: Secondary | ICD-10-CM | POA: Insufficient documentation

## 2016-01-07 DIAGNOSIS — M79621 Pain in right upper arm: Secondary | ICD-10-CM | POA: Diagnosis present

## 2016-01-07 DIAGNOSIS — M25521 Pain in right elbow: Secondary | ICD-10-CM

## 2016-01-07 NOTE — Patient Instructions (Signed)
ROM: Pendulum (Circular)  Let right arm move in circle clockwise, then counterclockwise, by rocking body weight in circular pattern. Circle _10___ times each direction per set. Do _3___ sessions per day.  Pendulum Side to Side  Bend forward 90 at waist, leaning on table for support. Rock body from side to side and let arm swing freely. Repeat _10___ times. Do __3__ sessions per day.     SHOULDER: Flexion On Table- sit to the side of the table and slide arm forward  Place hands on table, elbows straight. Move hips away from body. Press hands down into table. Hold _3__ seconds. _10__ reps per set, __3_ sets per day.  Elevation: Shrug (Distal Resist)  Also add circles forward and back   Lift shoulders straight up, then return. Maintain same speed up and down. Avoid moving head and neck forward. Repeat _10___ times per set. Do __1-2__ sets per session. Do many times daily. Copyright  VHI. All rights reserved.   Flexion (Assistive)    Clasp hands together and raise arms above head, keeping elbows as straight as possible. Can be done sitting or lying. Repeat ____ times. Do ____ sessions per day.  Copyright  VHI. All rights reserved.   [facility] [address] [place], [place] [address] Phone # [phone] Fax [phone]

## 2016-01-07 NOTE — Therapy (Addendum)
[facility] [address] [place], [place], [address] Phone: [phone]   Fax:  [phone]  Physical Therapy Evaluation  Patient Details  Name: Cathy Tucker MRN: [id number] Date of Birth: 05/27/1946 Referring Provider: Marchia Bond, MD  Encounter Date: 01/07/2016      PT End of Session - 01/07/16 1305    Visit Number 1   Date for PT Re-Evaluation 03/03/16   PT Start Time 1227   PT Stop Time 1307   PT Time Calculation (min) 40 min   Activity Tolerance Patient tolerated treatment well   Behavior During Therapy [facility] for tasks assessed/performed      Past Medical History  Diagnosis Date  . Fracture of humerus, proximal, right, closed 11/22/2015    Past Surgical History  Procedure Laterality Date  . Abdominal hysterectomy    . Carpal tunnel with cubital tunnel    . Hand surgery    . Orif humerus fracture Right 11/22/2015    Procedure: OPEN REDUCTION INTERNAL FIXATION (ORIF) RIGHT PROXIMAL HUMERUS FRACTURE;  Surgeon: Marchia Bond, MD;  Location: [facility];  Service: Orthopedics;  Laterality: Right;    There were no vitals filed for this visit.  Visit Diagnosis:  Shoulder stiffness, right - Plan: PT plan of care cert/re-cert  Pain in joint, upper arm, right - Plan: PT plan of care cert/re-cert  Weakness of right arm - Plan: PT plan of care cert/re-cert      Subjective Assessment - 01/07/16 1234    Subjective Pt presents to PT s/p Rt humerus ORIF sustained 11/15/15.  Surgery was 11/22/15.  Pt had a fall of the back of a truck.  Pt wore a sling until last week.     Pertinent History ORIF Rt humerus 11/22/15   Diagnostic tests X-ray 01/01/16- healing well.     Patient Stated Goals reduce Rt arm pain, improve use and AROM of the Rt arm   Currently in Pain? Yes   Pain Score 4    Pain Location Arm   Pain Orientation Right   Pain Descriptors / Indicators Sore;Shooting   Pain Type Surgical pain   Pain Onset More than a month ago   Pain Frequency Constant   Aggravating Factors  use of arm with work (computer), reaching overhead   Pain Relieving Factors rest, pain medication            [facility] PT Assessment - 01/07/16 0001    Assessment   Medical Diagnosis ORIF Rt prox. humerus fracture   Referring Provider Marchia Bond, MD   Onset Date/Surgical Date 11/15/15   Hand Dominance Right   Next MD Visit 01/29/16   Precautions   Precautions None;Other (comment)  osteopenia   Restrictions   Weight Bearing Restrictions No   Balance Screen   Has the patient fallen in the past 6 months Yes   How many times? 1  off the back of a truck- no balance deficits   Has the patient had a decrease in activity level because of a fear of falling?  No   Is the patient reluctant to leave their home because of a fear of falling?  No   Home Environment   Living Environment Private residence   Type of Home House   Prior Function   Level of Independence Independent   Vocation Full time employment   Vocation Requirements desk work   Leisure walks on treadmill, gardening   Cognition   Overall Cognitive Status Within  Functional Limits for tasks assessed   Observation/Other Assessments   Focus on Therapeutic Outcomes (FOTO)  59% limitation   Posture/Postural Control   Posture/Postural Control No significant limitations   ROM / Strength   AROM / PROM / Strength AROM;PROM;Strength   AROM   Overall AROM  Deficits   Overall AROM Comments Lt shoulder AROM is full   AROM Assessment Site Shoulder   Right/Left Shoulder Right   Right Shoulder Flexion 70 Degrees   Right Shoulder ABduction 55 Degrees   Right Shoulder Internal Rotation --  to Rt buttock   Right Shoulder External Rotation --  Rt lateral occiput with scapular substitution   PROM   Overall PROM  Deficits   PROM Assessment Site Shoulder   Right/Left Shoulder Right   Right Shoulder Flexion 86 Degrees   Right Shoulder ABduction 60  Degrees   Right Shoulder Internal Rotation 30 Degrees   Right Shoulder External Rotation 10 Degrees   Strength   Overall Strength Deficits   Overall Strength Comments Lt shoulder 5/5.  Rt shoulder tested in neutral with submax testing: flexion 4/5, exntension 4+/5, IR 4/5, ER 4-/5   Palpation   Palpation comment well healing surgical incision over anterior aspect of the glenohumeral joint and proximal humerus.  Reduced glenohumeral joint mobility in all directions.  Pain with palpation over anterior aspect and posterior aspect of glenohumeral joint.                             PT Education - 01/07/16 1300    Education provided Yes   Education Details HEP: shoulder shrugs, circles, active assistive flexion   Person(s) Educated Patient   Methods Explanation;Demonstration;Handout   Comprehension Verbalized understanding;Returned demonstration          PT Short Term Goals - 01/07/16 1311    PT SHORT TERM GOAL #1   Title be independent in initial HEP   Time 4   Period Weeks   Status New   PT SHORT TERM GOAL #2   Title demonstrate Rt shoulder AROM flexion to 90 degrees to improve use with reaching into cabinet   Time 4   Period Weeks   Status New   PT SHORT TERM GOAL #3   Title report < or = to 3/10 Rt shoulder pain with use   Time 4   Period Weeks   Status New   PT SHORT TERM GOAL #4   Title demonstrate Rt shoulder PROM ER to > or = to 45 degrees to improve use with fixing hair   Time 4   Status New           PT Long Term Goals - 01/07/16 1227    PT LONG TERM GOAL #1   Title be independent in advanced HEP   Time 8   Period Weeks   Status New   PT LONG TERM GOAL #2   Title reduce FOTO to < or = to 34% limitation   PT LONG TERM GOAL #3   Title demonstrate Rt shoulder AROM flexion to > or = to 110 degrees to improve reaching overhead   Time 8   Period Weeks   Status New   PT LONG TERM GOAL #4   Title demonstrate Rt shoulder PROM ER to > or = to  55 degrees   Time 8   Period Weeks   Status New   PT LONG TERM GOAL #5   Title  demonstrate Rt shoulder AROM IR to > or = to L3 to improve self-care   Time 8   Period Weeks   Status New   Additional Long Term Goals   Additional Long Term Goals Yes   PT LONG TERM GOAL #6   Title report < or  = to 2/10 Rt shoulder pain with use at work   Time 8   Period Weeks   Status New               Plan - 02/01/16 1307    Clinical Impression Statement Pt presents to PT s/p Rt humerus ORIF performed 2015/12/17.  Pt sustained injury after a fall 11/15/15.  Pt has been wearing a sling unitl last week.  Pt demonstrates Rt shoulder weakness, limited AROM and limited use with self-care and overhead.  Pt will benefit from skilled PT for Rt shoulder AROM, manual therapy, gentle strength progression and functional use of Rt UE   Pt will benefit from skilled therapeutic intervention in order to improve on the following deficits Decreased strength;Hypomobility;Impaired flexibility;Pain;Decreased activity tolerance;Impaired UE functional use;Decreased range of motion   Rehab Potential Good   PT Frequency 2x / week   PT Duration 8 weeks   PT Treatment/Interventions ADLs/Self Care Home Management;Cryotherapy;Electrical Stimulation;Moist Heat;Therapeutic exercise;Therapeutic activities;Ultrasound;Neuromuscular re-education;Patient/family education;Manual techniques;Taping;Dry needling;Passive range of motion;Scar mobilization   PT Next Visit Plan Pulleys and discuss home pulleys, Rt shoulder AAROM and PROM, isometrics, manual for joint mobs.  Modalities as needed.     Consulted and Agree with Plan of Care Patient          G-Codes - 02/01/2016 1227    Functional Assessment Tool Used --   Functional Limitation --   Other PT Primary Current Status [id number]) --   Other PT Primary Goal Status [id number]) --       Problem List Patient Active Problem List   Diagnosis Date Noted  . Fracture of humerus, proximal,  right, closed 12-17-2015  . Fracture, humerus, proximal 17-Dec-2015  G-codes:  Other PT primary Current: CK Goal: CK  Jalei Shibley, PT February 01, 2016, 1:17 PM  [facility] [address], [place], [place], [address] Phone: [phone]   Fax:  [phone]  Name: Cathy Tucker MRN: [id number] Date of Birth: 12/02/1945

## 2016-01-10 ENCOUNTER — Ambulatory Visit: Payer: Managed Care, Other (non HMO) | Admitting: Physical Therapy

## 2016-01-10 ENCOUNTER — Encounter: Payer: Self-pay | Admitting: Physical Therapy

## 2016-01-10 DIAGNOSIS — M25611 Stiffness of right shoulder, not elsewhere classified: Secondary | ICD-10-CM

## 2016-01-10 DIAGNOSIS — R29898 Other symptoms and signs involving the musculoskeletal system: Secondary | ICD-10-CM

## 2016-01-10 DIAGNOSIS — M25521 Pain in right elbow: Secondary | ICD-10-CM

## 2016-01-10 NOTE — Therapy (Signed)
[facility] [address], [place], [place], [address] Phone: [phone]   Fax:  [phone]  Physical Therapy Treatment  Patient Details  Name: Cathy Tucker MRN: [id number] Date of Birth: 01/08/1946 Referring Provider: Marchia Bond, MD  Encounter Date: 01/10/2016      PT End of Session - 01/10/16 1118    Visit Number 2   Date for PT Re-Evaluation 03/03/16   PT Start Time 1103   PT Stop Time 1145   PT Time Calculation (min) 42 min   Activity Tolerance Patient tolerated treatment well   Behavior During Therapy [facility] for tasks assessed/performed      Past Medical History  Diagnosis Date  . Fracture of humerus, proximal, right, closed 11/22/2015    Past Surgical History  Procedure Laterality Date  . Abdominal hysterectomy    . Carpal tunnel with cubital tunnel    . Hand surgery    . Orif humerus fracture Right 11/22/2015    Procedure: OPEN REDUCTION INTERNAL FIXATION (ORIF) RIGHT PROXIMAL HUMERUS FRACTURE;  Surgeon: Marchia Bond, MD;  Location: [facility];  Service: Orthopedics;  Laterality: Right;    There were no vitals filed for this visit.  Visit Diagnosis:  Shoulder stiffness, right  Pain in joint, upper arm, right  Weakness of right arm      Subjective Assessment - 01/10/16 1116    Subjective Pt reports no complain of pain, but soreness in Rt shoulder rated as 3-4/10.   Pertinent History ORIF Rt humerus 11/22/15   Diagnostic tests X-ray 01/01/16- healing well.     Patient Stated Goals reduce Rt arm pain, improve use and AROM of the Rt arm   Currently in Pain? Yes   Pain Score 3    Pain Location Shoulder   Pain Orientation Right   Pain Descriptors / Indicators Sore;Aching   Pain Type Surgical pain   Pain Onset More than a month ago   Pain Frequency Intermittent   Aggravating Factors  use of arm with work (computer), reaching overhead   Pain Relieving Factors rest, pain medication    Multiple Pain Sites No                         [facility] Adult PT Treatment/Exercise - 01/10/16 0001    Bed Mobility   Bed Mobility --  talked with patient about purchasing pulley's info handed to   Posture/Postural Control   Posture/Postural Control No significant limitations   Exercises   Exercises Shoulder   Shoulder Exercises: Seated   Other Seated Exercises AROM into all planes for assesment b/w activities   Shoulder Exercises: Standing   Flexion AAROM;10 reps  on walladdder   ABduction AAROM;10 reps  on walladder, pt fatique after task   Shoulder Elevation AROM  x 10 b/w walladdder activities   Other Standing Exercises Pendulum Rt UE   Shoulder Exercises: Pulleys   Flexion 3 minutes   ABduction 3 minutes  to peg 17   Manual Therapy   Manual Therapy Joint mobilization   Manual therapy comments caudal and posterior glide and traction    Joint Mobilization to Rt shoulder joint in supine                  PT Short Term Goals - 01/07/16 1311    PT SHORT TERM GOAL #1   Title be independent in initial HEP   Time 4   Period Weeks  Status New   PT SHORT TERM GOAL #2   Title demonstrate Rt shoulder AROM flexion to 90 degrees to improve use with reaching into cabinet   Time 4   Period Weeks   Status New   PT SHORT TERM GOAL #3   Title report < or = to 3/10 Rt shoulder pain with use   Time 4   Period Weeks   Status New   PT SHORT TERM GOAL #4   Title demonstrate Rt shoulder PROM ER to > or = to 45 degrees to improve use with fixing hair   Time 4   Status New           PT Long Term Goals - 01/07/16 1227    PT LONG TERM GOAL #1   Title be independent in advanced HEP   Time 8   Period Weeks   Status New   PT LONG TERM GOAL #2   Title reduce FOTO to < or = to 34% limitation   PT LONG TERM GOAL #3   Title demonstrate Rt shoulder AROM flexion to > or = to 110 degrees to improve reaching overhead   Time 8   Period Weeks   Status New    PT LONG TERM GOAL #4   Title demonstrate Rt shoulder PROM ER to > or = to 55 degrees   Time 8   Period Weeks   Status New   PT LONG TERM GOAL #5   Title demonstrate Rt shoulder AROM IR to > or = to L3 to improve self-care   Time 8   Period Weeks   Status New   Additional Long Term Goals   Additional Long Term Goals Yes   PT LONG TERM GOAL #6   Title report < or  = to 2/10 Rt shoulder pain with use at work   Time 8   Period Weeks   Status New               Plan - 01/10/16 1118    Clinical Impression Statement Pt able to tolerate pulleys and walladder exercises very well. ROM in Rt shoulder is limited and weakness limited use with selfcare as fixing the hair.   Pt will benefit from skilled therapeutic intervention in order to improve on the following deficits Decreased strength;Hypomobility;Impaired flexibility;Pain;Decreased activity tolerance;Impaired UE functional use;Decreased range of motion   Clinical Impairments Affecting Rehab Potential Rt humerus fracture ORIF performed 11/22/2015   PT Frequency 2x / week   PT Duration 8 weeks   PT Treatment/Interventions ADLs/Self Care Home Management;Cryotherapy;Electrical Stimulation;Moist Heat;Therapeutic exercise;Therapeutic activities;Ultrasound;Neuromuscular re-education;Patient/family education;Manual techniques;Taping;Dry needling;Passive range of motion;Scar mobilization   PT Next Visit Plan Pulleys and discuss home pulleys, Rt shoulder AAROM and PROM, isometrics, manual for joint mobs.  Modalities as needed.     Consulted and Agree with Plan of Care Patient        Problem List Patient Active Problem List   Diagnosis Date Noted  . Fracture of humerus, proximal, right, closed 11/22/2015  . Fracture, humerus, proximal 11/22/2015    NAUMANN-HOUEGNIFIO,Runa Whittingham PTA 01/10/2016, 11:56 AM  [facility] [address], [place], [place], [address] Phone: [phone]    Fax:  [phone]  Name: Cathy Tucker MRN: [id number] Date of Birth: 30-Dec-1945

## 2016-01-14 ENCOUNTER — Ambulatory Visit: Payer: Managed Care, Other (non HMO)

## 2016-01-14 DIAGNOSIS — M25611 Stiffness of right shoulder, not elsewhere classified: Secondary | ICD-10-CM | POA: Diagnosis not present

## 2016-01-14 DIAGNOSIS — R29898 Other symptoms and signs involving the musculoskeletal system: Secondary | ICD-10-CM

## 2016-01-14 DIAGNOSIS — M25521 Pain in right elbow: Secondary | ICD-10-CM

## 2016-01-14 NOTE — Patient Instructions (Signed)
Strengthening: Isometric Flexion  Using wall for resistance, press right fist into ball using light pressure. Hold _5___ seconds. Repeat _10___ times per set. Do _1-2___ sets per session. Do 1-2____ sessions per day.  Extension (Isometric)  Place left bent elbow and back of arm against wall. Press elbow against wall. Hold _5___ seconds. Repeat 10____ times. Do 1-2 sets.  Do _1-2___ sessions per day.  Strengthening: Isometric Adduction    Using body for resistance, gently press right arm into ball using light pressure. Hold _5___ seconds. Repeat _10___ times per set. Do _1-2___ sets per session. Do __1-2__ sessions per day.  http://orth.exer.us/811   Copyright  VHI. All rights reserved.  [facility] [address], [place], [place] [address] Phone # [phone] Fax [phone]

## 2016-01-14 NOTE — Therapy (Signed)
[facility] [address] [place], [place], [address] Phone: [phone]   Fax:  [phone]  Physical Therapy Treatment  Patient Details  Name: Cathy Tucker MRN: [id number] Date of Birth: June 03, 1946 Referring Provider: Marchia Bond, MD  Encounter Date: 01/14/2016      PT End of Session - 01/14/16 1556    Visit Number 3   Date for PT Re-Evaluation 03/03/16   PT Start Time 1529   PT Stop Time 1611   PT Time Calculation (min) 42 min   Activity Tolerance Patient tolerated treatment well   Behavior During Therapy [facility] for tasks assessed/performed      Past Medical History  Diagnosis Date  . Fracture of humerus, proximal, right, closed 11/22/2015    Past Surgical History  Procedure Laterality Date  . Abdominal hysterectomy    . Carpal tunnel with cubital tunnel    . Hand surgery    . Orif humerus fracture Right 11/22/2015    Procedure: OPEN REDUCTION INTERNAL FIXATION (ORIF) RIGHT PROXIMAL HUMERUS FRACTURE;  Surgeon: Marchia Bond, MD;  Location: [place];  Service: Orthopedics;  Laterality: Right;    There were no vitals filed for this visit.  Visit Diagnosis:  Shoulder stiffness, right  Pain in joint, upper arm, right  Weakness of right arm      Subjective Assessment - 01/14/16 1532    Subjective Worked 10.5 hours yesterday without a break so my arm is killing me today.  I bought a pulley over the weekend and am using at home.     Diagnostic tests X-ray 01/01/16- healing well.     Patient Stated Goals reduce Rt arm pain, improve use and AROM of the Rt arm   Currently in Pain? Yes   Pain Score 5    Pain Location Shoulder   Pain Orientation Right   Pain Descriptors / Indicators Sore;Aching   Pain Type Surgical pain   Pain Onset More than a month ago   Pain Frequency Intermittent   Aggravating Factors  use of arm at work, reaching overhead   Pain Relieving Factors rest, pain medication             [facility] PT Assessment - 01/14/16 0001    ROM / Strength   AROM / PROM / Strength AROM   AROM   Overall AROM  Deficits   AROM Assessment Site Shoulder   Right/Left Shoulder Right   Right Shoulder Flexion 90 Degrees   Right Shoulder ABduction 65 Degrees                     [facility] Adult PT Treatment/Exercise - 01/14/16 0001    Shoulder Exercises: Supine   Flexion AAROM;Both;20 reps  with cane   Other Supine Exercises chest press with cane 2x10   Shoulder Exercises: Standing   Flexion AAROM;10 reps  on walladdder   ABduction AAROM;10 reps  on wall ladder, pt with fatigue   Shoulder Exercises: Pulleys   Flexion 3 minutes   ABduction 3 minutes   Shoulder Exercises: Isometric Strengthening   Flexion Other (comment)  5"x10   Extension Other (comment)  5" x 10   ADduction Other (comment)  5" hold x 10   Manual Therapy   Manual Therapy Joint mobilization   Manual therapy comments caudal and posterior glide and traction    Joint Mobilization to Rt shoulder joint in supine  PT Education - 01/14/16 1553    Education provided Yes   Education Details shoulder isometrics   Person(s) Educated Patient   Methods Explanation;Demonstration;Handout   Comprehension Verbalized understanding;Returned demonstration          PT Short Term Goals - 01/14/16 1535    PT SHORT TERM GOAL #1   Title be independent in initial HEP   Time 4   Status On-going   PT SHORT TERM GOAL #2   Title demonstrate Rt shoulder AROM flexion to 90 degrees to improve use with reaching into cabinet   Status Achieved           PT Long Term Goals - 01/07/16 1227    PT LONG TERM GOAL #1   Title be independent in advanced HEP   Time 8   Period Weeks   Status New   PT LONG TERM GOAL #2   Title reduce FOTO to < or = to 34% limitation   PT LONG TERM GOAL #3   Title demonstrate Rt shoulder AROM flexion to > or = to 110 degrees to improve reaching overhead    Time 8   Period Weeks   Status New   PT LONG TERM GOAL #4   Title demonstrate Rt shoulder PROM ER to > or = to 55 degrees   Time 8   Period Weeks   Status New   PT LONG TERM GOAL #5   Title demonstrate Rt shoulder AROM IR to > or = to L3 to improve self-care   Time 8   Period Weeks   Status New   Additional Long Term Goals   Additional Long Term Goals Yes   PT LONG TERM GOAL #6   Title report < or  = to 2/10 Rt shoulder pain with use at work   Time 8   Period Weeks   Status New               Plan - 01/14/16 1539    Clinical Impression Statement Pt is using pulleys at home and is performing HEP for flexibility.  Rt shoulder AROM is improved.  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  Pt demonstrates limited AROM and strength s/p Rt shoulder ORIF and will benefit from skilled PT for Rt shoulder strength and AROM progression.   Pt will benefit from skilled therapeutic intervention in order to improve on the following deficits Decreased strength;Hypomobility;Impaired flexibility;Pain;Decreased activity tolerance;Impaired UE functional use;Decreased range of motion   Rehab Potential Good   Clinical Impairments Affecting Rehab Potential Rt humerus fracture ORIF performed 11/22/2015   PT Frequency 2x / week   PT Duration 8 weeks   PT Treatment/Interventions ADLs/Self Care Home Management;Cryotherapy;Electrical Stimulation;Moist Heat;Therapeutic exercise;Therapeutic activities;Ultrasound;Neuromuscular re-education;Patient/family education;Manual techniques;Taping;Dry needling;Passive range of motion;Scar mobilization   PT Next Visit Plan Pulleys and discuss home pulleys, Rt shoulder AAROM and PROM, isometrics, manual for joint mobs.  Modalities as needed.  Review isometrics.   Consulted and Agree with Plan of Care Patient        Problem List Patient Active Problem List   Diagnosis Date Noted  . Fracture of humerus, proximal,  right, closed 11/22/2015  . Fracture, humerus, proximal 11/22/2015   Sigurd Sos, PT 01/14/2016 4:12 PM  [facility] [address], [place], [place], [address] Phone: [phone]   Fax:  [phone]  Name: Cathy Tucker MRN: [id number] Date of Birth: 12-20-1945

## 2016-01-16 ENCOUNTER — Ambulatory Visit: Payer: Managed Care, Other (non HMO)

## 2016-01-16 DIAGNOSIS — M25611 Stiffness of right shoulder, not elsewhere classified: Secondary | ICD-10-CM

## 2016-01-16 DIAGNOSIS — R29898 Other symptoms and signs involving the musculoskeletal system: Secondary | ICD-10-CM

## 2016-01-16 DIAGNOSIS — M25521 Pain in right elbow: Secondary | ICD-10-CM

## 2016-01-16 NOTE — Therapy (Signed)
[facility] [address], [place], [place], [address] Phone: [phone]   Fax:  [phone]  Physical Therapy Treatment  Patient Details  Name: Cathy Tucker MRN: [id number] Date of Birth: [birth year] Referring Provider: Marchia Bond, MD  Encounter Date: 01/16/2016      PT End of Session - 01/16/16 1308    Visit Number 4   PT Start Time 1226   PT Stop Time 1308   PT Time Calculation (min) 42 min   Activity Tolerance Patient tolerated treatment well   Behavior During Therapy [facility] for tasks assessed/performed      Past Medical History  Diagnosis Date  . Fracture of humerus, proximal, right, closed 11/22/2015    Past Surgical History  Procedure Laterality Date  . Abdominal hysterectomy    . Carpal tunnel with cubital tunnel    . Hand surgery    . Orif humerus fracture Right 11/22/2015    Procedure: OPEN REDUCTION INTERNAL FIXATION (ORIF) RIGHT PROXIMAL HUMERUS FRACTURE;  Surgeon: Marchia Bond, MD;  Location: [place];  Service: Orthopedics;  Laterality: Right;    There were no vitals filed for this visit.  Visit Diagnosis:  Shoulder stiffness, right  Pain in joint, upper arm, right  Weakness of right arm      Subjective Assessment - 01/16/16 1231    Subjective Rt shoulder feeling sore today.  Not sleeping well.     Patient Stated Goals reduce Rt arm pain, improve use and AROM of the Rt arm   Currently in Pain? Yes   Pain Score 4    Pain Location Shoulder   Pain Orientation Right   Pain Descriptors / Indicators Sore;Aching   Pain Type Surgical pain   Pain Onset More than a month ago   Pain Frequency Intermittent   Aggravating Factors  use of arm at work, reaching overhead   Pain Relieving Factors rest, pain medication                         [facility] Adult PT Treatment/Exercise - 01/16/16 0001    Shoulder Exercises: Supine   Flexion AAROM;Both;20 reps  with cane   Other Supine Exercises chest press with cane 2x10   Shoulder Exercises: Standing   Flexion AAROM;10 reps  on walladdder   ABduction AAROM;10 reps  on wall ladder, pt with fatigue   Shoulder Exercises: Pulleys   Flexion 3 minutes   ABduction 3 minutes   Shoulder Exercises: ROM/Strengthening   UBE (Upper Arm Bike) Level 0 x 6 minutes    Shoulder Exercises: Isometric Strengthening   Flexion Other (comment)  5"x10   Extension Other (comment)  5" x 10   ADduction Other (comment)  5" hold x 10   Manual Therapy   Manual Therapy Joint mobilization   Manual therapy comments caudal and posterior glide and traction    Joint Mobilization to Rt shoulder joint in supine                  PT Short Term Goals - 01/14/16 1535    PT SHORT TERM GOAL #1   Title be independent in initial HEP   Time 4   Status On-going   PT SHORT TERM GOAL #2   Title demonstrate Rt shoulder AROM flexion to 90 degrees to improve use with reaching into cabinet   Status Achieved           PT Long Term Goals -  01/07/16 1227    PT LONG TERM GOAL #1   Title be independent in advanced HEP   Time 8   Period Weeks   Status New   PT LONG TERM GOAL #2   Title reduce FOTO to < or = to 34% limitation   PT LONG TERM GOAL #3   Title demonstrate Rt shoulder AROM flexion to > or = to 110 degrees to improve reaching overhead   Time 8   Period Weeks   Status New   PT LONG TERM GOAL #4   Title demonstrate Rt shoulder PROM ER to > or = to 55 degrees   Time 8   Period Weeks   Status New   PT LONG TERM GOAL #5   Title demonstrate Rt shoulder AROM IR to > or = to L3 to improve self-care   Time 8   Period Weeks   Status New   Additional Long Term Goals   Additional Long Term Goals Yes   PT LONG TERM GOAL #6   Title report < or  = to 2/10 Rt shoulder pain with use at work   Time 8   Period Weeks   Status New               Plan - 01/16/16 1233    Clinical Impression Statement Pt is using  pulleys at home and is independent with current HEP for flexibility and strnegth.  Rt shoulder AROM is improved.  Pt demonstrates scapular substution with Rt shoulder flexion and abduction against gravity.  Pt demonstrates limited AROM and strength s/p Rt shoulder ORIF and will benefit from skilled PT for Rt shoulder strength and AROM progression.    Pt will benefit from skilled therapeutic intervention in order to improve on the following deficits Decreased strength;Hypomobility;Impaired flexibility;Pain;Decreased activity tolerance;Impaired UE functional use;Decreased range of motion   Rehab Potential Good   Clinical Impairments Affecting Rehab Potential Rt humerus fracture ORIF performed 11/22/2015   PT Frequency 2x / week   PT Duration 8 weeks   PT Treatment/Interventions ADLs/Self Care Home Management;Cryotherapy;Electrical Stimulation;Moist Heat;Therapeutic exercise;Therapeutic activities;Ultrasound;Neuromuscular re-education;Patient/family education;Manual techniques;Taping;Dry needling;Passive range of motion;Scar mobilization   PT Next Visit Plan Pulleys and discuss home pulleys, Rt shoulder AAROM and PROM, isometrics, manual for joint mobs.  Modalities as needed.     Consulted and Agree with Plan of Care Patient        Problem List Patient Active Problem List   Diagnosis Date Noted  . Fracture of humerus, proximal, right, closed 11/22/2015  . Fracture, humerus, proximal 11/22/2015   Sigurd Sos, PT 01/16/2016 1:09 PM  [facility] [address] [place], [place], [address] Phone: [phone]   Fax:  [phone]  Name: Cathy Tucker MRN: [id number] Date of Birth: July 27, 1946

## 2016-01-20 ENCOUNTER — Ambulatory Visit: Payer: Managed Care, Other (non HMO) | Admitting: Physical Therapy

## 2016-01-20 ENCOUNTER — Encounter: Payer: Self-pay | Admitting: Physical Therapy

## 2016-01-20 DIAGNOSIS — M25521 Pain in right elbow: Secondary | ICD-10-CM

## 2016-01-20 DIAGNOSIS — R29898 Other symptoms and signs involving the musculoskeletal system: Secondary | ICD-10-CM

## 2016-01-20 DIAGNOSIS — M25611 Stiffness of right shoulder, not elsewhere classified: Secondary | ICD-10-CM | POA: Diagnosis not present

## 2016-01-20 NOTE — Therapy (Signed)
[facility] [address] [place], [place], [address] Phone: [phone]   Fax:  [phone]  Physical Therapy Treatment  Patient Details  Name: Cathy Tucker MRN: [id number] Date of Birth: 07-Mar-1946 Referring Provider: Marchia Bond, MD  Encounter Date: 01/20/2016      PT End of Session - 01/20/16 1230    Visit Number 5   Date for PT Re-Evaluation 03/03/16   PT Start Time [id number]   PT Stop Time 1228   PT Time Calculation (min) 47 min   Activity Tolerance Patient tolerated treatment well   Behavior During Therapy [facility] for tasks assessed/performed      Past Medical History  Diagnosis Date  . Fracture of humerus, proximal, right, closed 11/22/2015    Past Surgical History  Procedure Laterality Date  . Abdominal hysterectomy    . Carpal tunnel with cubital tunnel    . Hand surgery    . Orif humerus fracture Right 11/22/2015    Procedure: OPEN REDUCTION INTERNAL FIXATION (ORIF) RIGHT PROXIMAL HUMERUS FRACTURE;  Surgeon: Marchia Bond, MD;  Location: [place];  Service: Orthopedics;  Laterality: Right;    There were no vitals filed for this visit.  Visit Diagnosis:  Shoulder stiffness, right  Pain in joint, upper arm, right  Weakness of right arm      Subjective Assessment - 01/20/16 1224    Subjective Rt shoulder is sore today rated as 6-7/10, due to working in the garden yesterday.    Pertinent History ORIF Rt humerus 11/22/15   Diagnostic tests X-ray 01/01/16- healing well.     Patient Stated Goals reduce Rt arm pain, improve use and AROM of the Rt arm   Currently in Pain? Yes   Pain Score 7    Pain Location Shoulder   Pain Orientation Right   Pain Descriptors / Indicators Sore   Pain Type Surgical pain   Pain Onset More than a month ago   Pain Frequency Intermittent   Aggravating Factors  use of arm at work, reaching overhead   Pain Relieving Factors rest, pain medication   Multiple  Pain Sites No                         [facility] Adult PT Treatment/Exercise - 01/20/16 0001    Shoulder Exercises: Supine   Flexion AAROM;Both;20 reps  with cane   Other Supine Exercises chest press with cane 2x10   Shoulder Exercises: Standing   Flexion AAROM;10 reps  on walladder   ABduction AAROM;10 reps  on walladder   Shoulder Exercises: Pulleys   Flexion 3 minutes   ABduction 3 minutes   Shoulder Exercises: ROM/Strengthening   UBE (Upper Arm Bike) Level 0 x 6 minutes    Manual Therapy   Manual Therapy Joint mobilization;Soft tissue mobilization   Manual therapy comments caudal and posterior glide and traction    Joint Mobilization to Rt shoulder joint in supine   Soft tissue mobilization subscapularis and rhomboid release in supina and lleft sidelying                  PT Short Term Goals - 01/20/16 1234    PT SHORT TERM GOAL #1   Title be independent in initial HEP   Time 4   Period Weeks   Status On-going   PT SHORT TERM GOAL #2   Title demonstrate Rt shoulder AROM flexion to 90 degrees to improve use with  reaching into cabinet   Time 4   Period Weeks   Status Achieved   PT SHORT TERM GOAL #3   Title report < or = to 3/10 Rt shoulder pain with use   Time 4   Status On-going   PT SHORT TERM GOAL #4   Title demonstrate Rt shoulder PROM ER to > or = to 45 degrees to improve use with fixing hair   Time 4   Period Weeks   Status On-going           PT Long Term Goals - 01/07/16 1227    PT LONG TERM GOAL #1   Title be independent in advanced HEP   Time 8   Period Weeks   Status New   PT LONG TERM GOAL #2   Title reduce FOTO to < or = to 34% limitation   PT LONG TERM GOAL #3   Title demonstrate Rt shoulder AROM flexion to > or = to 110 degrees to improve reaching overhead   Time 8   Period Weeks   Status New   PT LONG TERM GOAL #4   Title demonstrate Rt shoulder PROM ER to > or = to 55 degrees   Time 8   Period Weeks   Status  New   PT LONG TERM GOAL #5   Title demonstrate Rt shoulder AROM IR to > or = to L3 to improve self-care   Time 8   Period Weeks   Status New   Additional Long Term Goals   Additional Long Term Goals Yes   PT LONG TERM GOAL #6   Title report < or  = to 2/10 Rt shoulder pain with use at work   Time 8   Period Weeks   Status New               Plan - 01/20/16 1231    Clinical Impression Statement Pt arrived with soreness in Rt shoulder rated as 6-7/10, after treatment soreness was 4/10. Pt with palpaple tenderness on subscapularis. Pt will continue to benefit from skilled PT for shoulder strength and AROM progress   Pt will benefit from skilled therapeutic intervention in order to improve on the following deficits Decreased strength;Hypomobility;Impaired flexibility;Pain;Decreased activity tolerance;Impaired UE functional use;Decreased range of motion   Clinical Impairments Affecting Rehab Potential Rt humerus fracture ORIF performed 11/22/2015   PT Frequency 2x / week   PT Duration 8 weeks   PT Next Visit Plan Pulleys,  Rt shoulder AAROM and PROM, isometrics, manual for joint mobs.  Modalities as needed.     Consulted and Agree with Plan of Care Patient        Problem List Patient Active Problem List   Diagnosis Date Noted  . Fracture of humerus, proximal, right, closed 11/22/2015  . Fracture, humerus, proximal 11/22/2015    NAUMANN-HOUEGNIFIO,Derwood Becraft PTA 01/20/2016, 12:53 PM  [facility] [address] [place], [place], [address] Phone: [phone]   Fax:  [phone]  Name: Cathy Tucker MRN: [id number] Date of Birth: 10/12/1946

## 2016-01-23 ENCOUNTER — Ambulatory Visit: Payer: Managed Care, Other (non HMO)

## 2016-01-23 DIAGNOSIS — M25611 Stiffness of right shoulder, not elsewhere classified: Secondary | ICD-10-CM | POA: Diagnosis not present

## 2016-01-23 DIAGNOSIS — R29898 Other symptoms and signs involving the musculoskeletal system: Secondary | ICD-10-CM

## 2016-01-23 DIAGNOSIS — M25521 Pain in right elbow: Secondary | ICD-10-CM

## 2016-01-23 NOTE — Therapy (Signed)
[facility] [address], [place], [place], [address] Phone: [phone]   Fax:  [phone]  Physical Therapy Treatment  Patient Details  Name: Cathy Tucker MRN: [id number] Date of Birth: 01-Jul-1946 Referring Provider: Marchia Bond, MD  Encounter Date: 01/23/2016      PT End of Session - 01/23/16 1214    Visit Number 6   Date for PT Re-Evaluation 03/03/16   PT Start Time 1140   PT Stop Time 1222   PT Time Calculation (min) 42 min   Activity Tolerance Patient tolerated treatment well   Behavior During Therapy [facility] for tasks assessed/performed      Past Medical History  Diagnosis Date  . Fracture of humerus, proximal, right, closed 11/22/2015    Past Surgical History  Procedure Laterality Date  . Abdominal hysterectomy    . Carpal tunnel with cubital tunnel    . Hand surgery    . Orif humerus fracture Right 11/22/2015    Procedure: OPEN REDUCTION INTERNAL FIXATION (ORIF) RIGHT PROXIMAL HUMERUS FRACTURE;  Surgeon: Marchia Bond, MD;  Location: [place];  Service: Orthopedics;  Laterality: Right;    There were no vitals filed for this visit.  Visit Diagnosis:  Shoulder stiffness, right  Pain in joint, upper arm, right  Weakness of right arm      Subjective Assessment - 01/23/16 1148    Subjective Pain is a little better 5/10 today.  Sleeping a little better.     Patient Stated Goals reduce Rt arm pain, improve use and AROM of the Rt arm   Currently in Pain? Yes   Pain Score 5    Pain Location Shoulder   Pain Orientation Right   Pain Descriptors / Indicators Sore   Pain Type Surgical pain   Pain Onset More than a month ago   Pain Frequency Intermittent                         [facility] Adult PT Treatment/Exercise - 01/23/16 0001    Shoulder Exercises: Supine   Flexion AAROM;Both;20 reps  with cane   Shoulder Exercises: Seated   Flexion Strengthening;20 reps   Shoulder Exercises: Standing   Flexion AAROM;10 reps  on walladder   ABduction AAROM;10 reps  on walladder   Shoulder Exercises: Pulleys   Flexion 3 minutes   ABduction 3 minutes   Shoulder Exercises: ROM/Strengthening   UBE (Upper Arm Bike) Level 1 x 6 minutes    Manual Therapy   Manual Therapy Joint mobilization;Soft tissue mobilization   Manual therapy comments caudal and posterior glide and traction    Joint Mobilization to Rt shoulder joint in supine   Soft tissue mobilization soft tissue to triceps, lateral deltoid and anterior deltoid                  PT Short Term Goals - 01/20/16 1234    PT SHORT TERM GOAL #1   Title be independent in initial HEP   Time 4   Period Weeks   Status On-going   PT SHORT TERM GOAL #2   Title demonstrate Rt shoulder AROM flexion to 90 degrees to improve use with reaching into cabinet   Time 4   Period Weeks   Status Achieved   PT SHORT TERM GOAL #3   Title report < or = to 3/10 Rt shoulder pain with use   Time 4   Status On-going   PT  SHORT TERM GOAL #4   Title demonstrate Rt shoulder PROM ER to > or = to 45 degrees to improve use with fixing hair   Time 4   Period Weeks   Status On-going           PT Long Term Goals - 01/07/16 1227    PT LONG TERM GOAL #1   Title be independent in advanced HEP   Time 8   Period Weeks   Status New   PT LONG TERM GOAL #2   Title reduce FOTO to < or = to 34% limitation   PT LONG TERM GOAL #3   Title demonstrate Rt shoulder AROM flexion to > or = to 110 degrees to improve reaching overhead   Time 8   Period Weeks   Status New   PT LONG TERM GOAL #4   Title demonstrate Rt shoulder PROM ER to > or = to 55 degrees   Time 8   Period Weeks   Status New   PT LONG TERM GOAL #5   Title demonstrate Rt shoulder AROM IR to > or = to L3 to improve self-care   Time 8   Period Weeks   Status New   Additional Long Term Goals   Additional Long Term Goals Yes   PT LONG TERM GOAL #6    Title report < or  = to 2/10 Rt shoulder pain with use at work   Time 8   Period Weeks   Status New               Plan - 01/23/16 1149    Clinical Impression Statement Pt with continued Rt shoulder/arm pain that has improved from last session.  Pt over used the shoulder over the weekend to increase the pain.  Pt with continued strength and AROM deficits s/p ORIF.  Pt will continue to benefit from skilled PT for Rt shoulder AROM, strength and endurance.     Pt will benefit from skilled therapeutic intervention in order to improve on the following deficits Decreased strength;Hypomobility;Impaired flexibility;Pain;Decreased activity tolerance;Impaired UE functional use;Decreased range of motion   Rehab Potential Good   Clinical Impairments Affecting Rehab Potential Rt humerus fracture ORIF performed 11/22/2015   PT Frequency 2x / week   PT Duration 8 weeks   PT Treatment/Interventions ADLs/Self Care Home Management;Cryotherapy;Electrical Stimulation;Moist Heat;Therapeutic exercise;Therapeutic activities;Ultrasound;Neuromuscular re-education;Patient/family education;Manual techniques;Taping;Dry needling;Passive range of motion;Scar mobilization   PT Next Visit Plan Pulleys,  Rt shoulder AAROM and PROM, isometrics, manual for joint mobs.  Modalities as needed.     Consulted and Agree with Plan of Care Patient        Problem List Patient Active Problem List   Diagnosis Date Noted  . Fracture of humerus, proximal, right, closed 11/22/2015  . Fracture, humerus, proximal 11/22/2015    Sigurd Sos, PT 01/23/2016 12:18 PM  [facility] [address] [place], [place], [address] Phone: [phone]   Fax:  [phone]  Name: Cathy Tucker MRN: [id number] Date of Birth: 10/26/1946

## 2016-01-28 ENCOUNTER — Ambulatory Visit: Payer: Managed Care, Other (non HMO)

## 2016-01-28 DIAGNOSIS — R29898 Other symptoms and signs involving the musculoskeletal system: Secondary | ICD-10-CM

## 2016-01-28 DIAGNOSIS — M25611 Stiffness of right shoulder, not elsewhere classified: Secondary | ICD-10-CM

## 2016-01-28 DIAGNOSIS — M25521 Pain in right elbow: Secondary | ICD-10-CM

## 2016-01-28 NOTE — Therapy (Signed)
[facility] [address] [place], [place], [address] Phone: [phone]   Fax:  [phone]  Physical Therapy Treatment  Patient Details  Name: Cathy Tucker MRN: [id number] Date of Birth: 1946-04-24 Referring Provider: Marchia Bond, MD  Encounter Date: 01/28/2016      PT End of Session - 01/28/16 1605    Visit Number 7   Date for PT Re-Evaluation 03/03/16   PT Start Time 1525   PT Stop Time 1606   PT Time Calculation (min) 41 min   Activity Tolerance Patient tolerated treatment well   Behavior During Therapy [facility] for tasks assessed/performed      Past Medical History  Diagnosis Date  . Fracture of humerus, proximal, right, closed 11/22/2015    Past Surgical History  Procedure Laterality Date  . Abdominal hysterectomy    . Carpal tunnel with cubital tunnel    . Hand surgery    . Orif humerus fracture Right 11/22/2015    Procedure: OPEN REDUCTION INTERNAL FIXATION (ORIF) RIGHT PROXIMAL HUMERUS FRACTURE;  Surgeon: Marchia Bond, MD;  Location: [place];  Service: Orthopedics;  Laterality: Right;    There were no vitals filed for this visit.  Visit Diagnosis:  Shoulder stiffness, right  Pain in joint, upper arm, right  Weakness of right arm      Subjective Assessment - 01/28/16 1531    Subjective Rt shoulder is feeling better.  didn't do too much yard work.  Slept last night without waking with pain for the first time.     Diagnostic tests X-ray 01/01/16- healing well.     Patient Stated Goals reduce Rt arm pain, improve use and AROM of the Rt arm   Currently in Pain? Yes   Pain Score 4    Pain Location Shoulder   Pain Orientation Right   Pain Descriptors / Indicators Aching;Burning   Pain Type Surgical pain   Pain Onset More than a month ago   Pain Frequency Intermittent   Aggravating Factors  use of arm at work, reaching overhead, sleep at night   Pain Relieving Factors rest, pain  medication            [facility] PT Assessment - 01/28/16 0001    AROM   Overall AROM  Deficits   AROM Assessment Site Shoulder   Right/Left Shoulder Right   Right Shoulder Flexion 113 Degrees   Right Shoulder ABduction 89 Degrees   PROM   Right Shoulder External Rotation 45 Degrees                     [facility] Adult PT Treatment/Exercise - 01/28/16 0001    Shoulder Exercises: Supine   Flexion AAROM;Both;20 reps  with cane   Shoulder Exercises: Seated   Flexion Strengthening;20 reps   Shoulder Exercises: Standing   Flexion AAROM;10 reps  on walladder   Shoulder Flexion Weight (lbs) 1   ABduction AAROM;10 reps  on walladder   Shoulder ABduction Weight (lbs) 1   Other Standing Exercises cone stack to 2nd shelf 2x1 minute   Shoulder Exercises: Pulleys   Flexion 3 minutes   ABduction 3 minutes   Shoulder Exercises: ROM/Strengthening   UBE (Upper Arm Bike) Level 1 x 8 minutes                   PT Short Term Goals - 01/28/16 1532    PT SHORT TERM GOAL #1   Title be independent in  initial HEP   Status Achieved   PT SHORT TERM GOAL #2   Title demonstrate Rt shoulder AROM flexion to 90 degrees to improve use with reaching into cabinet   Status Achieved   PT SHORT TERM GOAL #3   Title report < or = to 3/10 Rt shoulder pain with use   Time 4   Period Weeks   Status On-going  3-4/10   PT SHORT TERM GOAL #4   Title demonstrate Rt shoulder PROM ER to > or = to 45 degrees to improve use with fixing hair   Status Achieved           PT Long Term Goals - 01/28/16 1536    PT LONG TERM GOAL #1   Title be independent in advanced HEP   Time 8   Period Weeks   Status On-going   PT LONG TERM GOAL #3   Title demonstrate Rt shoulder AROM flexion to > or = to 110 degrees to improve reaching overhead   Status Achieved   PT LONG TERM GOAL #6   Title report < or  = to 2/10 Rt shoulder pain with use at work   Time 8   Period Weeks   Status On-going  3-4/10                Plan - 01/28/16 1537    Clinical Impression Statement Pt reports 85% use of Rt UE.  Pt reports continued endurance defictits with fixing her hair in the morning.  Pt with imrpoved Lt shoulder AROM: flexion 113 degrees and abduction 89 degrees.  Pt reports 3-4/10 Rt shoulder pain with use. Pt tolerated addition of weights with exercise today.   Pt with continued limited AROM and functional strength s/p Rt shoulder ORIF and will benefit from PT for ROM and strenth progression.     Pt will benefit from skilled therapeutic intervention in order to improve on the following deficits Decreased strength;Hypomobility;Impaired flexibility;Pain;Decreased activity tolerance;Impaired UE functional use;Decreased range of motion   Rehab Potential Good   Clinical Impairments Affecting Rehab Potential Rt humerus fracture ORIF performed 11/22/2015   PT Frequency 2x / week   PT Duration 8 weeks   PT Treatment/Interventions ADLs/Self Care Home Management;Cryotherapy;Electrical Stimulation;Moist Heat;Therapeutic exercise;Therapeutic activities;Ultrasound;Neuromuscular re-education;Patient/family education;Manual techniques;Taping;Dry needling;Passive range of motion;Scar mobilization   PT Next Visit Plan Pulleys,  Rt shoulder AAROM and PROM,  reaching overhead. manual for joint mobs.  Modalities as needed.  Begin Rockwood for strength        Problem List Patient Active Problem List   Diagnosis Date Noted  . Fracture of humerus, proximal, right, closed 11/22/2015  . Fracture, humerus, proximal 11/22/2015    Sigurd Sos, PT 01/28/2016 4:07 PM  [facility] [address] [place], [place], [address] Phone: [phone]   Fax:  [phone]  Name: Cathy Tucker MRN: [id number] Date of Birth: 1946/01/08

## 2016-01-31 ENCOUNTER — Encounter: Payer: Self-pay | Admitting: Physical Therapy

## 2016-01-31 ENCOUNTER — Ambulatory Visit: Payer: Managed Care, Other (non HMO) | Admitting: Physical Therapy

## 2016-01-31 DIAGNOSIS — M25521 Pain in right elbow: Secondary | ICD-10-CM

## 2016-01-31 DIAGNOSIS — M25611 Stiffness of right shoulder, not elsewhere classified: Secondary | ICD-10-CM | POA: Diagnosis not present

## 2016-01-31 DIAGNOSIS — R29898 Other symptoms and signs involving the musculoskeletal system: Secondary | ICD-10-CM

## 2016-01-31 NOTE — Patient Instructions (Addendum)
Strengthening: Resisted Internal Rotation   Hold tubing in left hand, elbow at side and forearm out. Rotate forearm in across body.  Make sure elbow sticks to your ribcage Repeat 5-10 times per set. Do   2-3 sets per session. Do 2 sessions per day.  http://orth.exer.us/830   Copyright  VHI. All rights reserved.  Strengthening: Resisted External Rotation   Hold tubing in right hand, elbow at side keep your elbow at your ribcage (not seen in picture) Rotate forearm out. Repeat ____ times per set. Do ____ sets per session. Do ____ sessions per day.  http://orth.exer.us/828   Copyright  VHI. All rights reserved.  Strengthening: Resisted Flexion   Practice with bend elbow moving arm straight forward Hold tubing with left arm at side. Pull forward and up. Move shoulder through pain-free range of motion. Repeat ____ times per set. Do ____ sets per session. Do ____ sessions per day.  http://orth.exer.us/824   Copyright  VHI. All rights reserved.  Strengthening: Resisted Extension   Use both arms at same time, this is easier to maintain a neutral spine and avoid rotation. Hold tubing in right hand, arm forward. Pull arm back, elbow straight. Repeat ____ times per set. Do ____ sets per session. Do ____ sessions per day.  http://orth.exer.us/832   Copyright  VHI. All rights reserved.  [facility] [address], [place], [place] [address] Phone # [phone] Fax [phone]

## 2016-01-31 NOTE — Therapy (Signed)
[facility] [address] [place], [place], [address] Phone: [phone]   Fax:  [phone]  Physical Therapy Treatment  Patient Details  Name: Cathy Tucker MRN: [id number] Date of Birth: 09/23/1946 Referring Provider: Marchia Bond, MD  Encounter Date: 01/31/2016      PT End of Session - 01/31/16 0941    Visit Number 8   Date for PT Re-Evaluation 03/03/16   PT Start Time 0930   PT Stop Time [id number]   PT Time Calculation (min) 45 min   Activity Tolerance Patient tolerated treatment well   Behavior During Therapy [facility] for tasks assessed/performed      Past Medical History  Diagnosis Date  . Fracture of humerus, proximal, right, closed 11/22/2015    Past Surgical History  Procedure Laterality Date  . Abdominal hysterectomy    . Carpal tunnel with cubital tunnel    . Hand surgery    . Orif humerus fracture Right 11/22/2015    Procedure: OPEN REDUCTION INTERNAL FIXATION (ORIF) RIGHT PROXIMAL HUMERUS FRACTURE;  Surgeon: Marchia Bond, MD;  Location: [place];  Service: Orthopedics;  Laterality: Right;    There were no vitals filed for this visit.  Visit Diagnosis:  Shoulder stiffness, right  Pain in joint, upper arm, right  Weakness of right arm      Subjective Assessment - 01/31/16 0930    Subjective Rt shoulder always with an ache and pain increases with reaching over head or lifting. Sleep was disturbed last night due to shoulder pain. Pt has MD appointment on W[place]day April 5th   Pertinent History ORIF Rt humerus 11/22/15   Diagnostic tests X-ray 01/01/16- healing well.     Patient Stated Goals reduce Rt arm pain, improve use and AROM of the Rt arm   Currently in Pain? Yes   Pain Score 4    Pain Location Shoulder   Pain Orientation Right   Pain Descriptors / Indicators Aching;Burning   Pain Type Surgical pain   Pain Onset More than a month ago   Pain Frequency Intermittent   Aggravating Factors  use of arm at work, reaching overhead, sleep at night   Pain Relieving Factors rest, pain medication   Multiple Pain Sites No                         [facility] Adult PT Treatment/Exercise - 01/31/16 0001    Posture/Postural Control   Posture/Postural Control No significant limitations   Exercises   Exercises Shoulder   Shoulder Exercises: Supine   Flexion AAROM;Both;20 reps  with cane 1.5# added   Other Supine Exercises chest press with cane 2x10 1.5# added   Shoulder Exercises: Seated   Flexion Strengthening;20 reps   Shoulder Exercises: Standing   Flexion AAROM;10 reps   ABduction AAROM;10 reps   Other Standing Exercises cone stack to 2nd shelf 2x1 minute  1# added   Other Standing Exercises Rockwood with yellow t-band x10, x 5 each direction   Shoulder Exercises: Pulleys   Flexion 3 minutes   ABduction 3 minutes   Shoulder Exercises: ROM/Strengthening   UBE (Upper Arm Bike) Level 2 x 8 (4/4) minutes                 PT Education - 01/31/16 1003    Education provided Yes   Education Details Rockwood with yellow t-band   Person(s) Educated Patient   Methods Explanation;Demonstration   Comprehension Verbalized understanding;Returned  demonstration          PT Short Term Goals - 01/28/16 1532    PT SHORT TERM GOAL #1   Title be independent in initial HEP   Status Achieved   PT SHORT TERM GOAL #2   Title demonstrate Rt shoulder AROM flexion to 90 degrees to improve use with reaching into cabinet   Status Achieved   PT SHORT TERM GOAL #3   Title report < or = to 3/10 Rt shoulder pain with use   Time 4   Period Weeks   Status On-going  3-4/10   PT SHORT TERM GOAL #4   Title demonstrate Rt shoulder PROM ER to > or = to 45 degrees to improve use with fixing hair   Status Achieved           PT Long Term Goals - 01/28/16 1536    PT LONG TERM GOAL #1   Title be independent in advanced HEP   Time 8   Period Weeks    Status On-going   PT LONG TERM GOAL #3   Title demonstrate Rt shoulder AROM flexion to > or = to 110 degrees to improve reaching overhead   Status Achieved   PT LONG TERM GOAL #6   Title report < or  = to 2/10 Rt shoulder pain with use at work   Time 8   Period Weeks   Status On-going  3-4/10               Plan - 01/31/16 0941    Clinical Impression Statement Pt reports 85% use of Rt UE. Still challenging to fix the hair due to limited ROM and endurance. Pt will continue to benefit from skilled PT to advance strength and endurance and improve ROM.    Pt will benefit from skilled therapeutic intervention in order to improve on the following deficits Decreased strength;Hypomobility;Impaired flexibility;Pain;Decreased activity tolerance;Impaired UE functional use;Decreased range of motion   Rehab Potential Good   Clinical Impairments Affecting Rehab Potential Rt humerus fracture ORIF performed 11/22/2015   PT Frequency 2x / week   PT Duration 8 weeks   PT Treatment/Interventions ADLs/Self Care Home Management;Cryotherapy;Electrical Stimulation;Moist Heat;Therapeutic exercise;Therapeutic activities;Ultrasound;Neuromuscular re-education;Patient/family education;Manual techniques;Taping;Dry needling;Passive range of motion;Scar mobilization   PT Next Visit Plan MD progress note, Take ROM and strength, Rt shoulder AAROM and PROM, reaching overhead, Beginn Rockwood for strength   Consulted and Agree with Plan of Care Patient        Problem List Patient Active Problem List   Diagnosis Date Noted  . Fracture of humerus, proximal, right, closed 11/22/2015  . Fracture, humerus, proximal 11/22/2015    NAUMANN-HOUEGNIFIO,[name] PTA 01/31/2016, 10:09 AM  [facility] [address] [place], [place], [address] Phone: [phone]   Fax:  [phone]  Name: Cathy Tucker MRN: [id number] Date of Birth: 1946-04-02

## 2016-02-03 ENCOUNTER — Ambulatory Visit: Payer: Managed Care, Other (non HMO) | Attending: Orthopedic Surgery | Admitting: Physical Therapy

## 2016-02-03 ENCOUNTER — Encounter: Payer: Self-pay | Admitting: Physical Therapy

## 2016-02-03 DIAGNOSIS — M6281 Muscle weakness (generalized): Secondary | ICD-10-CM | POA: Diagnosis present

## 2016-02-03 DIAGNOSIS — M25521 Pain in right elbow: Secondary | ICD-10-CM

## 2016-02-03 DIAGNOSIS — R29898 Other symptoms and signs involving the musculoskeletal system: Secondary | ICD-10-CM | POA: Diagnosis present

## 2016-02-03 DIAGNOSIS — M25611 Stiffness of right shoulder, not elsewhere classified: Secondary | ICD-10-CM | POA: Diagnosis present

## 2016-02-03 DIAGNOSIS — M79621 Pain in right upper arm: Secondary | ICD-10-CM | POA: Diagnosis present

## 2016-02-03 DIAGNOSIS — M25511 Pain in right shoulder: Secondary | ICD-10-CM | POA: Insufficient documentation

## 2016-02-03 NOTE — Therapy (Signed)
[facility] [address], [place], [place], [address] Phone: [phone]   Fax:  [phone]  Physical Therapy Treatment  Patient Details  Name: Cathy Tucker MRN: [id number] Date of Birth: Jan 25, 1946 Referring Provider: Davene Costain  Encounter Date: 02/03/2016      PT End of Session - 02/03/16 1251    Visit Number 9   Date for PT Re-Evaluation 03/03/16   PT Start Time 1140   PT Stop Time 1236   PT Time Calculation (min) 56 min   Activity Tolerance Patient tolerated treatment well   Behavior During Therapy [facility] for tasks assessed/performed      Past Medical History  Diagnosis Date  . Fracture of humerus, proximal, right, closed 11/22/2015    Past Surgical History  Procedure Laterality Date  . Abdominal hysterectomy    . Carpal tunnel with cubital tunnel    . Hand surgery    . Orif humerus fracture Right 11/22/2015    Procedure: OPEN REDUCTION INTERNAL FIXATION (ORIF) RIGHT PROXIMAL HUMERUS FRACTURE;  Surgeon: Marchia Bond, MD;  Location: [place];  Service: Orthopedics;  Laterality: Right;    There were no vitals filed for this visit.  Visit Diagnosis:  Shoulder stiffness, right  Pain in joint, upper arm, right  Weakness of right arm      Subjective Assessment - 02/03/16 1249    Subjective Pt reports her Rt shoulder always aches and pain increases with reaching over head and lifting. Pt was working in the garden yesterday and feels very sore today. Pt has MD visit on Wednesday April 5th    Pertinent History ORIF Rt humerus 11/22/15   Diagnostic tests X-ray 01/01/16- healing well.     Patient Stated Goals reduce Rt arm pain, improve use and AROM of the Rt arm   Currently in Pain? Yes   Pain Score 3    Pain Location Shoulder   Pain Orientation Right   Pain Descriptors / Indicators Aching;Sore   Pain Type Surgical pain   Pain Onset More than a month ago   Pain Frequency Intermittent   Aggravating Factors  blow drying hair, reaching into cabinets, lifting two dinner plates   Pain Relieving Factors rest, pain medication   Multiple Pain Sites No            [facility] PT Assessment - 02/03/16 0001    Assessment   Medical Diagnosis ORIF Rt prox. humerus fracture   Referring Provider Davene Costain   Onset Date/Surgical Date 11/15/15   Hand Dominance Right   Next MD Visit 02/05/16   Precautions   Precautions None;Other (comment)  osteopenia   Restrictions   Weight Bearing Restrictions No   Balance Screen   Has the patient fallen in the past 6 months Yes   How many times? 1  no balance deficits falling of the back of a truck   Has the patient had a decrease in activity level because of a fear of falling?  No   Is the patient reluctant to leave their home because of a fear of falling?  No   Home Environment   Living Environment Private residence   Type of Home House   Prior Function   Level of Independence Independent   Vocation Full time employment   Vocation Requirements desk work   Leisure walks on treadmill, gardening   Cognition   Overall Cognitive Status Within [place] for tasks assessed   Posture/Postural Control   Posture/Postural  Control No significant limitations   ROM / Strength   AROM / PROM / Strength AROM   AROM   Overall AROM  Deficits   AROM Assessment Site Shoulder   Right/Left Shoulder Right   Right Shoulder Flexion 115 Degrees   Right Shoulder ABduction 90 Degrees  with shoulder elevation   Right Shoulder Internal Rotation 42 Degrees   Right Shoulder External Rotation 40 Degrees   Strength   Overall Strength Within functional limits for tasks performed   Overall Strength Comments Rt shoulder 4/5    Palpation   Palpation comment well healed scare   Bed Mobility   Bed Mobility --  pt has pulleys at home                     [facility] Adult PT Treatment/Exercise - 02/03/16 0001    Exercises   Exercises Shoulder    Shoulder Exercises: Supine   Flexion AAROM;Both;20 reps  with cane 1.5# added, incr weight next visit   Other Supine Exercises chest press with cane 2x10 1.5# added   Shoulder Exercises: Standing   Other Standing Exercises cone stack to 2nd shelf 2x1 minute   Other Standing Exercises elevation/depression and abd with short arc in front of mirrow 2 x 10  to re-establish scapular humeral rhythm   Shoulder Exercises: Pulleys   Flexion 3 minutes   ABduction 3 minutes   Shoulder Exercises: ROM/Strengthening   UBE (Upper Arm Bike) Level 2 x 8 (4/4) minutes    Modalities   Modalities Moist Heat   Moist Heat Therapy   Number Minutes Moist Heat 10 Minutes   Moist Heat Location Shoulder   Manual Therapy   Manual Therapy Joint mobilization;Soft tissue mobilization   Manual therapy comments caudal and posterior glide and traction    Joint Mobilization to Rt shoulder joint in supine   Soft tissue mobilization soft tissue to triceps, lateral deltoid and anterior deltoid                  PT Short Term Goals - 02/03/16 1220    PT SHORT TERM GOAL #1   Title be independent in initial HEP   Time 4   Period Weeks   Status Achieved   PT SHORT TERM GOAL #2   Title demonstrate Rt shoulder AROM flexion to 90 degrees to improve use with reaching into cabinet   Time 4   Period Weeks   Status Achieved   PT SHORT TERM GOAL #3   Title report < or = to 3/10 Rt shoulder pain with use   Time 4   Period Weeks   Status Achieved   PT SHORT TERM GOAL #4   Title demonstrate Rt shoulder PROM ER to > or = to 45 degrees to improve use with fixing hair   Time 4   Period Weeks   Status Achieved           PT Long Term Goals - 02/03/16 1221    PT LONG TERM GOAL #1   Title be independent in advanced HEP   Time 8   Period Weeks   PT LONG TERM GOAL #2   Title reduce FOTO to < or = to 34% limitation   Time 8   Period Weeks   Status On-going   PT LONG TERM GOAL #3   Title demonstrate Rt  shoulder AROM flexion to > or = to 110 degrees to improve reaching overhead   Time 8  Period Weeks   Status Achieved   PT LONG TERM GOAL #4   Title demonstrate Rt shoulder PROM ER to > or = to 55 degrees   Time 8   Period Weeks   Status On-going   PT LONG TERM GOAL #5   Title demonstrate Rt shoulder AROM IR to > or = to L3 to improve self-care   Time 8   Period Weeks   Status On-going   PT LONG TERM GOAL #6   Title report < or  = to 2/10 Rt shoulder pain with use at work   Time 8   Period Weeks   Status On-going               Plan - 02/03/16 1252    Clinical Impression Statement Pt reports 75% to 80% improvement since start of PT. It is still challenging to fix the hair due to limited ROM and strength. AROM right shoulder in degrees: flexion 115, abduction 90. Strength Rt shoulder overall 4/5. Pt wiill continue to benefit from skilled PT to improve ROM without compensatory movement and increase strength and endurance.      Pt will benefit from skilled therapeutic intervention in order to improve on the following deficits Decreased strength;Hypomobility;Impaired flexibility;Pain;Decreased activity tolerance;Impaired UE functional use;Decreased range of motion   Rehab Potential Good   Clinical Impairments Affecting Rehab Potential Rt humerus fracture ORIF performed 11/22/2015   PT Frequency 2x / week   PT Duration 8 weeks   PT Treatment/Interventions ADLs/Self Care Home Management;Cryotherapy;Electrical Stimulation;Moist Heat;Therapeutic exercise;Therapeutic activities;Ultrasound;Neuromuscular re-education;Patient/family education;Manual techniques;Taping;Dry needling;Passive range of motion;Scar mobilization   PT Next Visit Plan see what MD has to say. Continue with ROM, strength, reaching overhead, STW and PROM for subscapularis and rhomboid release   Consulted and Agree with Plan of Care Patient        Problem List Patient Active Problem List   Diagnosis Date Noted  .  Fracture of humerus, proximal, right, closed 11/22/2015  . Fracture, humerus, proximal 11/22/2015    Ting Cage Naumann-Houegnifio, PTA 02/03/2016 1:00 PM  [facility] [address] [place], [place], [address] Phone: [phone]   Fax:  [phone]  Name: Cathy Tucker MRN: [id number] Date of Birth: [birth year]

## 2016-02-06 ENCOUNTER — Encounter: Payer: Self-pay | Admitting: Physical Therapy

## 2016-02-06 ENCOUNTER — Ambulatory Visit: Payer: Managed Care, Other (non HMO) | Admitting: Physical Therapy

## 2016-02-06 DIAGNOSIS — R29898 Other symptoms and signs involving the musculoskeletal system: Secondary | ICD-10-CM

## 2016-02-06 DIAGNOSIS — M25611 Stiffness of right shoulder, not elsewhere classified: Secondary | ICD-10-CM

## 2016-02-06 DIAGNOSIS — M25521 Pain in right elbow: Secondary | ICD-10-CM

## 2016-02-06 NOTE — Therapy (Addendum)
[facility] [address] [place], [place], [address] Phone: [phone]   Fax:  [phone]  Physical Therapy Treatment  Patient Details  Name: Cathy Tucker MRN: [id number] Date of Birth: 13-Jan-1946 Referring Provider: Davene Costain  Encounter Date: 02/06/2016      PT End of Session - 02/06/16 1159    Visit Number 10   Date for PT Re-Evaluation 03/03/16   PT Start Time 1145   PT Stop Time 1240   PT Time Calculation (min) 55 min   Activity Tolerance Patient tolerated treatment well   Behavior During Therapy [facility] for tasks assessed/performed      Past Medical History  Diagnosis Date  . Fracture of humerus, proximal, right, closed 11/22/2015    Past Surgical History  Procedure Laterality Date  . Abdominal hysterectomy    . Carpal tunnel with cubital tunnel    . Hand surgery    . Orif humerus fracture Right 11/22/2015    Procedure: OPEN REDUCTION INTERNAL FIXATION (ORIF) RIGHT PROXIMAL HUMERUS FRACTURE;  Surgeon: Marchia Bond, MD;  Location: [place];  Service: Orthopedics;  Laterality: Right;    There were no vitals filed for this visit.  Visit Diagnosis:  Shoulder stiffness, right  Pain in joint, upper arm, right  Weakness of right arm      Subjective Assessment - 02/06/16 1152    Subjective Pt saw MD yesterday and he is pleased with progress so far. He informed her that it can take up to a year until full ROM is restored.   Pertinent History ORIF Rt humerus 11/22/15   Diagnostic tests X-ray 01/01/16- healing well.     Patient Stated Goals reduce Rt arm pain, improve use and AROM of the Rt arm   Currently in Pain? Yes   Pain Score 1    Pain Location Shoulder   Pain Orientation Right   Pain Descriptors / Indicators Aching;Sore   Pain Type Surgical pain   Pain Onset More than a month ago   Pain Frequency Intermittent   Aggravating Factors  blow dry hair, reaching into cabinets, lifting  two dinner plated   Pain Relieving Factors rest, pain medication   Multiple Pain Sites No                         [facility] Adult PT Treatment/Exercise - 02/06/16 0001    Bed Mobility   Bed Mobility --  pt has pulleys at home   Posture/Postural Control   Posture/Postural Control No significant limitations   Exercises   Exercises Shoulder   Shoulder Exercises: Supine   Other Supine Exercises Triceps Lt with 2 # 2 x10   Shoulder Exercises: Prone   Extension Strengthening;Left;20 reps;Weights   Extension Weight (lbs) 2   Other Prone Exercises triceps with 2# with short arc   Shoulder Exercises: Standing   Flexion 20 reps  biceps with 2# 2 x 20   Extension Strengthening;20 reps  Triceps ext in standing with 2#   Other Standing Exercises cone stack to 2nd shelf 2x1 minute  flexion , abduction   Other Standing Exercises elevation/depression and abd with short arc in front of mirrow 2 x 10   Shoulder Exercises: ROM/Strengthening   UBE (Upper Arm Bike) Level 2 x 8 (4/4) minutes    Modalities   Modalities Moist Heat   Moist Heat Therapy   Number Minutes Moist Heat 10 Minutes   Moist Heat Location  Shoulder   Manual Therapy   Manual Therapy Soft tissue mobilization   Soft tissue mobilization to triceps with PROM and biceps, pt with several TP                  PT Short Term Goals - 02/03/16 1220    PT SHORT TERM GOAL #1   Title be independent in initial HEP   Time 4   Period Weeks   Status Achieved   PT SHORT TERM GOAL #2   Title demonstrate Rt shoulder AROM flexion to 90 degrees to improve use with reaching into cabinet   Time 4   Period Weeks   Status Achieved   PT SHORT TERM GOAL #3   Title report < or = to 3/10 Rt shoulder pain with use   Time 4   Period Weeks   Status Achieved   PT SHORT TERM GOAL #4   Title demonstrate Rt shoulder PROM ER to > or = to 45 degrees to improve use with fixing hair   Time 4   Period Weeks   Status Achieved            PT Long Term Goals - 02/03/16 1221    PT LONG TERM GOAL #1   Title be independent in advanced HEP   Time 8   Period Weeks   PT LONG TERM GOAL #2   Title reduce FOTO to < or = to 34% limitation   Time 8   Period Weeks   Status On-going   PT LONG TERM GOAL #3   Title demonstrate Rt shoulder AROM flexion to > or = to 110 degrees to improve reaching overhead   Time 8   Period Weeks   Status Achieved   PT LONG TERM GOAL #4   Title demonstrate Rt shoulder PROM ER to > or = to 55 degrees   Time 8   Period Weeks   Status On-going   PT LONG TERM GOAL #5   Title demonstrate Rt shoulder AROM IR to > or = to L3 to improve self-care   Time 8   Period Weeks   Status On-going   PT LONG TERM GOAL #6   Title report < or  = to 2/10 Rt shoulder pain with use at work   Time 8   Period Weeks   Status On-going               Plan - 02/06/16 1200    Clinical Impression Statement Pt reports pain is more located in elbow and distal biceps area. Pt with weakness in triceps as noticed with triceps ecerxises. Pt will continue to benefit from skilled PT to advance to improve ROM, strength and endurance.   Pt will benefit from skilled therapeutic intervention in order to improve on the following deficits Decreased strength;Hypomobility;Impaired flexibility;Pain;Decreased activity tolerance;Impaired UE functional use;Decreased range of motion   Rehab Potential Good   Clinical Impairments Affecting Rehab Potential Rt humerus fracture ORIF performed 11/22/2015   PT Frequency 2x / week   PT Duration 8 weeks   PT Treatment/Interventions ADLs/Self Care Home Management;Cryotherapy;Electrical Stimulation;Moist Heat;Therapeutic exercise;Therapeutic activities;Ultrasound;Neuromuscular re-education;Patient/family education;Manual techniques;Taping;Dry needling;Passive range of motion;Scar mobilization   PT Next Visit Plan MD is pleased with progress. Continue with ROM, strength, reaching overhead,  STW and PROM for subscapularis and rhomboid release   Consulted and Agree with Plan of Care Patient        Problem List Patient Active Problem List   Diagnosis Date  Noted  . Fracture of humerus, proximal, right, closed Dec 06, 2015  . Fracture, humerus, proximal 2015-12-06  G-codes: Other PT primary CK current CK goal  NAUMANN-HOUEGNIFIO,Norm Wray PTA 02/06/2016, 12:35 PM  [facility] [address], [place], [place], [address] Phone: [phone]   Fax:  [phone]  Name: JENNIFERANN BOOMSMA MRN: [id number] Date of Birth: May 27, 1946

## 2016-02-10 ENCOUNTER — Ambulatory Visit: Payer: Managed Care, Other (non HMO) | Admitting: Physical Therapy

## 2016-02-10 ENCOUNTER — Encounter: Payer: Self-pay | Admitting: Physical Therapy

## 2016-02-10 DIAGNOSIS — M25611 Stiffness of right shoulder, not elsewhere classified: Secondary | ICD-10-CM | POA: Diagnosis not present

## 2016-02-10 DIAGNOSIS — M25521 Pain in right elbow: Secondary | ICD-10-CM

## 2016-02-10 DIAGNOSIS — R29898 Other symptoms and signs involving the musculoskeletal system: Secondary | ICD-10-CM

## 2016-02-10 NOTE — Therapy (Signed)
[facility] [address] [place], [place], [address] Phone: [phone]   Fax:  [phone]  Physical Therapy Treatment  Patient Details  Name: LATISA ZURICK MRN: [id number] Date of Birth: 1945/11/25 Referring Provider: Davene Costain  Encounter Date: 02/10/2016      PT End of Session - 02/10/16 1210    Visit Number 11   Date for PT Re-Evaluation 03/03/16   PT Start Time 1143   PT Stop Time 1238   PT Time Calculation (min) 55 min   Activity Tolerance Patient tolerated treatment well   Behavior During Therapy [facility] for tasks assessed/performed      Past Medical History  Diagnosis Date  . Fracture of humerus, proximal, right, closed 11/22/2015    Past Surgical History  Procedure Laterality Date  . Abdominal hysterectomy    . Carpal tunnel with cubital tunnel    . Hand surgery    . Orif humerus fracture Right 11/22/2015    Procedure: OPEN REDUCTION INTERNAL FIXATION (ORIF) RIGHT PROXIMAL HUMERUS FRACTURE;  Surgeon: Marchia Bond, MD;  Location: [place];  Service: Orthopedics;  Laterality: Right;    There were no vitals filed for this visit.      Subjective Assessment - 02/10/16 1203    Subjective Pt denies pain, but very stiff in the morning she starts with her pulleys before taking her shower.    Pertinent History ORIF Rt humerus 11/22/15   Diagnostic tests X-ray 01/01/16- healing well.     Patient Stated Goals reduce Rt arm pain, improve use and AROM of the Rt arm   Currently in Pain? No/denies                         [facility] Adult PT Treatment/Exercise - 02/10/16 0001    Bed Mobility   Bed Mobility --  pt has pulleys at home   Posture/Postural Control   Posture/Postural Control No significant limitations   Exercises   Exercises Shoulder   Shoulder Exercises: Supine   Other Supine Exercises Triceps Lt with 2 # 2 x10   Shoulder Exercises: Prone   Extension  Strengthening;Left;20 reps;Weights   Extension Weight (lbs) 2   Other Prone Exercises triceps with 2# with short arc   Shoulder Exercises: Standing   Flexion Both;20 reps  with 2#   Other Standing Exercises cone stack to 2nd shelf 1x1 minute, 1 # added   Other Standing Exercises elevation/depression and abd with short arc in front of mirrow 2 x 10   Shoulder Exercises: ROM/Strengthening   UBE (Upper Arm Bike) Level 2 x 8 (4/4) minutes, sitting on green physioball    Other ROM/Strengthening Exercises chestpress 10# 2 x10   Modalities   Modalities Moist Heat   Moist Heat Therapy   Number Minutes Moist Heat 10 Minutes   Moist Heat Location Shoulder   Manual Therapy   Manual Therapy Soft tissue mobilization   Manual therapy comments caudal and posterior glide and traction    Soft tissue mobilization to triceps with PROM and biceps, pt with several TP                  PT Short Term Goals - 02/03/16 1220    PT SHORT TERM GOAL #1   Title be independent in initial HEP   Time 4   Period Weeks   Status Achieved   PT SHORT TERM GOAL #2   Title demonstrate Rt shoulder  AROM flexion to 90 degrees to improve use with reaching into cabinet   Time 4   Period Weeks   Status Achieved   PT SHORT TERM GOAL #3   Title report < or = to 3/10 Rt shoulder pain with use   Time 4   Period Weeks   Status Achieved   PT SHORT TERM GOAL #4   Title demonstrate Rt shoulder PROM ER to > or = to 45 degrees to improve use with fixing hair   Time 4   Period Weeks   Status Achieved           PT Long Term Goals - 02/10/16 1212    PT LONG TERM GOAL #1   Title be independent in advanced HEP   Time 8   Period Weeks   Status On-going   PT LONG TERM GOAL #2   Title reduce FOTO to < or = to 34% limitation   Time 8   Period Weeks   Status On-going   PT LONG TERM GOAL #3   Title demonstrate Rt shoulder AROM flexion to > or = to 110 degrees to improve reaching overhead   Time 8   Period  Weeks   PT LONG TERM GOAL #4   Status On-going   PT LONG TERM GOAL #5   Title demonstrate Rt shoulder AROM IR to > or = to L3 to improve self-care   Time 8   Period Weeks   Status On-going   PT LONG TERM GOAL #6   Title report < or  = to 2/10 Rt shoulder pain with use at work   Time 8   Period Weeks   Status On-going               Plan - 02/10/16 1211    Clinical Impression Statement Pt with weakness in triceps and compensatory movement with overhead flexion and abduction. pt will continue to benefit from skilled PT to improve ROM, strength and endurance.   Rehab Potential Good   Clinical Impairments Affecting Rehab Potential Rt humerus fracture ORIF performed 11/22/2015   PT Frequency 2x / week   PT Duration 8 weeks   PT Treatment/Interventions ADLs/Self Care Home Management;Cryotherapy;Electrical Stimulation;Moist Heat;Therapeutic exercise;Therapeutic activities;Ultrasound;Neuromuscular re-education;Patient/family education;Manual techniques;Taping;Dry needling;Passive range of motion;Scar mobilization   PT Next Visit Plan Continue with ROM, strength, reaching overhead, STW and PROM for subscapularis and rhomboid release   Consulted and Agree with Plan of Care Patient      Patient will benefit from skilled therapeutic intervention in order to improve the following deficits and impairments:  Decreased strength, Hypomobility, Impaired flexibility, Pain, Decreased activity tolerance, Impaired UE functional use, Decreased range of motion  Visit Diagnosis: Shoulder stiffness, right  Pain in joint, upper arm, right  Weakness of right arm     Problem List Patient Active Problem List   Diagnosis Date Noted  . Fracture of humerus, proximal, right, closed 11/22/2015  . Fracture, humerus, proximal 11/22/2015    NAUMANN-HOUEGNIFIO,Lorenza Winkleman PTA 02/10/2016, 12:41 PM  [facility] [address], [place], [place],  [address] Phone: [phone]   Fax:  [phone]  Name: DEMARI BIRKS MRN: [id number] Date of Birth: June 24, 1946

## 2016-02-13 ENCOUNTER — Encounter: Payer: Self-pay | Admitting: Physical Therapy

## 2016-02-13 ENCOUNTER — Ambulatory Visit: Payer: Managed Care, Other (non HMO) | Admitting: Physical Therapy

## 2016-02-13 DIAGNOSIS — M25511 Pain in right shoulder: Secondary | ICD-10-CM

## 2016-02-13 DIAGNOSIS — M6281 Muscle weakness (generalized): Secondary | ICD-10-CM

## 2016-02-13 DIAGNOSIS — M25611 Stiffness of right shoulder, not elsewhere classified: Secondary | ICD-10-CM

## 2016-02-13 NOTE — Therapy (Signed)
[facility] [address], [place], [place], [address] Phone: [phone]   Fax:  [phone]  Physical Therapy Treatment  Patient Details  Name: Cathy Tucker MRN: [id number] Date of Birth: 1946-04-06 Referring Provider: Davene Costain  Encounter Date: 02/13/2016      PT End of Session - 02/13/16 0952    Visit Number 12   Date for PT Re-Evaluation 03/03/16   PT Start Time 0933   PT Stop Time 1030   PT Time Calculation (min) 57 min   Activity Tolerance Patient tolerated treatment well   Behavior During Therapy [facility] for tasks assessed/performed      Past Medical History  Diagnosis Date  . Fracture of humerus, proximal, right, closed 11/22/2015    Past Surgical History  Procedure Laterality Date  . Abdominal hysterectomy    . Carpal tunnel with cubital tunnel    . Hand surgery    . Orif humerus fracture Right 11/22/2015    Procedure: OPEN REDUCTION INTERNAL FIXATION (ORIF) RIGHT PROXIMAL HUMERUS FRACTURE;  Surgeon: Marchia Bond, MD;  Location: [place];  Service: Orthopedics;  Laterality: Right;    There were no vitals filed for this visit.      Subjective Assessment - 02/13/16 0937    Subjective No complain of pain, but stiffness rated as 5/10 today, I may have been sitting at the [facility] to much this week.    Pertinent History ORIF Rt humerus 11/22/15   Diagnostic tests X-ray 01/01/16- healing well.     Currently in Pain? No/denies  Just stiffness in Rt shoulder            [facility] PT Assessment - 02/13/16 0001    Assessment   Medical Diagnosis ORIF Rt prox. humerus fracture   Onset Date/Surgical Date 11/15/15   Hand Dominance Right   Precautions   Precautions None;Other (comment)  osteopenia   Prior Function   Level of Independence Independent   Vocation Full time employment   Cognition   Overall Cognitive Status Within Functional Limits for tasks assessed                      [facility] Adult PT Treatment/Exercise - 02/13/16 0001    Bed Mobility   Bed Mobility --  pt has pulleys and foam roll at home   Posture/Postural Control   Posture/Postural Control No significant limitations   Exercises   Exercises Shoulder   Shoulder Exercises: Supine   Other Supine Exercises Foam Roll x18mn, Overhead flexion 3# added, snowangel, pro/ret each 2 x10   Shoulder Exercises: Standing   Other Standing Exercises wallclock for stretching Rt UE, reaching up to 12o'clock 2 x10   Other Standing Exercises elevation/depression and abd with short arc in front of mirrow 2 x 10   Shoulder Exercises: Pulleys   Flexion 3 minutes   Shoulder Exercises: ROM/Strengthening   UBE (Upper Arm Bike) Level 2 x 8 (4/4) minutes, sitting on green physioball    Other ROM/Strengthening Exercises chestpress 10# 2 x10   Modalities   Modalities Moist Heat   Moist Heat Therapy   Number Minutes Moist Heat 10 Minutes   Moist Heat Location Shoulder   Manual Therapy   Manual Therapy Soft tissue mobilization   Manual therapy comments caudal and posterior glide and traction    Soft tissue mobilization to triceps with PROM and biceps, pt with several TP  PT Short Term Goals - 02/03/16 1220    PT SHORT TERM GOAL #1   Title be independent in initial HEP   Time 4   Period Weeks   Status Achieved   PT SHORT TERM GOAL #2   Title demonstrate Rt shoulder AROM flexion to 90 degrees to improve use with reaching into cabinet   Time 4   Period Weeks   Status Achieved   PT SHORT TERM GOAL #3   Title report < or = to 3/10 Rt shoulder pain with use   Time 4   Period Weeks   Status Achieved   PT SHORT TERM GOAL #4   Title demonstrate Rt shoulder PROM ER to > or = to 45 degrees to improve use with fixing hair   Time 4   Period Weeks   Status Achieved           PT Long Term Goals - 02/10/16 1212    PT LONG TERM GOAL #1   Title be independent in  advanced HEP   Time 8   Period Weeks   Status On-going   PT LONG TERM GOAL #2   Title reduce FOTO to < or = to 34% limitation   Time 8   Period Weeks   Status On-going   PT LONG TERM GOAL #3   Title demonstrate Rt shoulder AROM flexion to > or = to 110 degrees to improve reaching overhead   Time 8   Period Weeks   PT LONG TERM GOAL #4   Status On-going   PT LONG TERM GOAL #5   Title demonstrate Rt shoulder AROM IR to > or = to L3 to improve self-care   Time 8   Period Weeks   Status On-going   PT LONG TERM GOAL #6   Title report < or  = to 2/10 Rt shoulder pain with use at work   Time 8   Period Weeks   Status On-going               Plan - 02/13/16 0954    Clinical Impression Statement Pt with limited ROM Rt shoulder and generalized weakness in Rt shouldergirdel. Pt will continue to benefit from skilled PT to improve ROM, strength and endurance.    Rehab Potential Good   Clinical Impairments Affecting Rehab Potential Rt humerus fracture ORIF performed 11/22/2015   PT Frequency 2x / week   PT Duration 8 weeks   PT Treatment/Interventions ADLs/Self Care Home Management;Cryotherapy;Electrical Stimulation;Moist Heat;Therapeutic exercise;Therapeutic activities;Ultrasound;Neuromuscular re-education;Patient/family education;Manual techniques;Taping;Dry needling;Passive range of motion;Scar mobilization   PT Next Visit Plan Continue with Foam Roll, ROM, strength, reaching overhead, STW and PROM for subscapularis and rhomboid release   Consulted and Agree with Plan of Care Patient      Patient will benefit from skilled therapeutic intervention in order to improve the following deficits and impairments:  Decreased strength, Hypomobility, Impaired flexibility, Pain, Decreased activity tolerance, Impaired UE functional use, Decreased range of motion  Visit Diagnosis: Muscle weakness (generalized) - Plan: PT plan of care cert/re-cert  Stiffness of right shoulder, not elsewhere  classified - Plan: PT plan of care cert/re-cert  Pain in right shoulder - Plan: PT plan of care cert/re-cert     Problem List Patient Active Problem List   Diagnosis Date Noted  . Fracture of humerus, proximal, right, closed 11/22/2015  . Fracture, humerus, proximal 11/22/2015    NAUMANN-HOUEGNIFIO,Jamilia Jacques PTA 02/13/2016, 1:54 PM  [facility] [address] [place]  [address] [place], [place], [address] Phone: [phone]   Fax:  [phone]  Name: Cathy Tucker MRN: [id number] Date of Birth: [birth year]

## 2016-02-17 ENCOUNTER — Ambulatory Visit: Payer: Managed Care, Other (non HMO) | Admitting: Physical Therapy

## 2016-02-17 ENCOUNTER — Encounter: Payer: Self-pay | Admitting: Physical Therapy

## 2016-02-17 DIAGNOSIS — M25511 Pain in right shoulder: Secondary | ICD-10-CM

## 2016-02-17 DIAGNOSIS — M25611 Stiffness of right shoulder, not elsewhere classified: Secondary | ICD-10-CM | POA: Diagnosis not present

## 2016-02-17 DIAGNOSIS — M25521 Pain in right elbow: Secondary | ICD-10-CM

## 2016-02-17 DIAGNOSIS — M6281 Muscle weakness (generalized): Secondary | ICD-10-CM

## 2016-02-17 DIAGNOSIS — R29898 Other symptoms and signs involving the musculoskeletal system: Secondary | ICD-10-CM

## 2016-02-17 NOTE — Therapy (Signed)
[facility] [address] [place], [place], [address] Phone: [phone]   Fax:  [phone]  Physical Therapy Treatment  Patient Details  Name: Cathy Tucker MRN: [id number] Date of Birth: Feb 23, 1946 Referring Provider: Davene Costain  Encounter Date: 02/17/2016      PT End of Session - 02/17/16 1125    Visit Number 12   Date for PT Re-Evaluation 03/03/16   PT Start Time 1103   PT Stop Time 1202   PT Time Calculation (min) 59 min   Activity Tolerance Patient tolerated treatment well   Behavior During Therapy [facility] for tasks assessed/performed      Past Medical History  Diagnosis Date  . Fracture of humerus, proximal, right, closed 11/22/2015    Past Surgical History  Procedure Laterality Date  . Abdominal hysterectomy    . Carpal tunnel with cubital tunnel    . Hand surgery    . Orif humerus fracture Right 11/22/2015    Procedure: OPEN REDUCTION INTERNAL FIXATION (ORIF) RIGHT PROXIMAL HUMERUS FRACTURE;  Surgeon: Marchia Bond, MD;  Location: [facility];  Service: Orthopedics;  Laterality: Right;    There were no vitals filed for this visit.      Subjective Assessment - 02/17/16 1107    Subjective No complain of stiffness, but soreness rated as 4/10. Pt worked a lot in the garden last weekend.    Pertinent History ORIF Rt humerus 11/22/15   Diagnostic tests X-ray 01/01/16- healing well.     Patient Stated Goals reduce Rt arm pain, improve use and AROM of the Rt arm   Currently in Pain? No/denies                         [facility] Adult PT Treatment/Exercise - 02/17/16 0001    Bed Mobility   Bed Mobility --  pt has pulleys and foam roll at home   Posture/Postural Control   Posture/Postural Control No significant limitations   Exercises   Exercises Shoulder   Shoulder Exercises: Supine   Other Supine Exercises Foam Roll x20mn, Overhead flexion 3# added, snowangel, pro/ret each 2  x10  Cane with 3 # into bil overhead flexion   Other Supine Exercises Triceps stretch contract/relaxe x 3 with 20 sec hold    Shoulder Exercises: Standing   Other Standing Exercises wallclock for stretching Rt UE, reaching up to 12o'clock 2 x10   Other Standing Exercises elevation/depression and abd with short arc in front of mirrow 2 x 10   Shoulder Exercises: Pulleys   Flexion 3 minutes   Shoulder Exercises: ROM/Strengthening   UBE (Upper Arm Bike) Level 2 x 8 (4/4) minutes, sitting on green physioball    Other ROM/Strengthening Exercises chestpress 10# 2 x10  horizontal/ vertical grip   Modalities   Modalities Moist Heat   Moist Heat Therapy   Number Minutes Moist Heat 10 Minutes   Moist Heat Location Shoulder   Manual Therapy   Manual Therapy Soft tissue mobilization   Manual therapy comments caudal and posterior glide and traction    Soft tissue mobilization to triceps with PROM and biceps, pt with several TP                  PT Short Term Goals - 02/03/16 1220    PT SHORT TERM GOAL #1   Title be independent in initial HEP   Time 4   Period Weeks   Status Achieved  PT SHORT TERM GOAL #2   Title demonstrate Rt shoulder AROM flexion to 90 degrees to improve use with reaching into cabinet   Time 4   Period Weeks   Status Achieved   PT SHORT TERM GOAL #3   Title report < or = to 3/10 Rt shoulder pain with use   Time 4   Period Weeks   Status Achieved   PT SHORT TERM GOAL #4   Title demonstrate Rt shoulder PROM ER to > or = to 45 degrees to improve use with fixing hair   Time 4   Period Weeks   Status Achieved           PT Long Term Goals - 02/17/16 1129    PT LONG TERM GOAL #1   Title be independent in advanced HEP   Time 8   Period Weeks   Status On-going   PT LONG TERM GOAL #2   Title reduce FOTO to < or = to 34% limitation   Time 8   Period Weeks   Status On-going   PT LONG TERM GOAL #3   Title demonstrate Rt shoulder AROM flexion to > or  = to 110 degrees to improve reaching overhead   Time 8   Period Weeks   Status Achieved   PT LONG TERM GOAL #4   Title demonstrate Rt shoulder PROM ER to > or = to 55 degrees   Time 8   Period Weeks   Status On-going   PT LONG TERM GOAL #5   Title demonstrate Rt shoulder AROM IR to > or = to L3 to improve self-care   Time 8   Period Weeks   Status On-going   PT LONG TERM GOAL #6   Title report < or  = to 2/10 Rt shoulder pain with use at work   Time 8   Period Weeks   Status On-going               Plan - 02/17/16 1126    Clinical Impression Statement Pt with limited ROM Rt shoulder and generalized weaknes in Rt shouldergirdel. Pt will continue to benefit from skilled PT to imporve ROM, strength and endurance.    Rehab Potential Good   Clinical Impairments Affecting Rehab Potential Rt humerus fracture ORIF performed 11/22/2015   PT Frequency 2x / week   PT Duration 8 weeks   PT Treatment/Interventions ADLs/Self Care Home Management;Cryotherapy;Electrical Stimulation;Moist Heat;Therapeutic exercise;Therapeutic activities;Ultrasound;Neuromuscular re-education;Patient/family education;Manual techniques;Taping;Dry needling;Passive range of motion;Scar mobilization   PT Next Visit Plan Continue with Foam Roll, ROM, strength, reaching overhead, STW and PROM for subscapulari, rhomboid release and triceps   Consulted and Agree with Plan of Care Patient      Patient will benefit from skilled therapeutic intervention in order to improve the following deficits and impairments:  Decreased strength, Hypomobility, Impaired flexibility, Pain, Decreased activity tolerance, Impaired UE functional use, Decreased range of motion  Visit Diagnosis: Muscle weakness (generalized)  Stiffness of right shoulder, not elsewhere classified  Pain in right shoulder  Shoulder stiffness, right  Pain in joint, upper arm, right  Weakness of right arm     Problem List Patient Active Problem  List   Diagnosis Date Noted  . Fracture of humerus, proximal, right, closed 11/22/2015  . Fracture, humerus, proximal 11/22/2015   Alaiza Yau Naumann-Houegnifio, PTA 02/17/2016 11:50 AM  [facility] [address] [place], [place], [address] Phone: [phone]   Fax:  [phone]  Name: Cathy Tucker MRN: [id number] Date of Birth: [birth year]

## 2016-02-20 ENCOUNTER — Encounter: Payer: Self-pay | Admitting: Physical Therapy

## 2016-02-20 ENCOUNTER — Ambulatory Visit: Payer: Managed Care, Other (non HMO) | Admitting: Physical Therapy

## 2016-02-20 DIAGNOSIS — M6281 Muscle weakness (generalized): Secondary | ICD-10-CM

## 2016-02-20 DIAGNOSIS — M25611 Stiffness of right shoulder, not elsewhere classified: Secondary | ICD-10-CM | POA: Diagnosis not present

## 2016-02-20 DIAGNOSIS — M25511 Pain in right shoulder: Secondary | ICD-10-CM

## 2016-02-20 NOTE — Therapy (Signed)
[facility] [address] [place], [place], [address] Phone: [phone]   Fax:  [phone]  Physical Therapy Treatment  Patient Details  Name: BREYONA SWANDER MRN: [id number] Date of Birth: 08-26-1946 Referring Provider: Hardin Negus  Encounter Date: 02/20/2016      PT End of Session - 02/20/16 0854    Visit Number 13   Date for PT Re-Evaluation 03/03/16   PT Start Time 0844   PT Stop Time 0945   PT Time Calculation (min) 61 min   Activity Tolerance Patient tolerated treatment well   Behavior During Therapy [facility] for tasks assessed/performed      Past Medical History  Diagnosis Date  . Fracture of humerus, proximal, right, closed 11/22/2015    Past Surgical History  Procedure Laterality Date  . Abdominal hysterectomy    . Carpal tunnel with cubital tunnel    . Hand surgery    . Orif humerus fracture Right 11/22/2015    Procedure: OPEN REDUCTION INTERNAL FIXATION (ORIF) RIGHT PROXIMAL HUMERUS FRACTURE;  Surgeon: Teryl Lucy, MD;  Location: [facility];  Service: Orthopedics;  Laterality: Right;    There were no vitals filed for this visit.      Subjective Assessment - 02/20/16 0848    Subjective No complain of pain, but reaching overhead with Rt UE iand lifting object is difficult,     Pertinent History ORIF Rt humerus 11/22/15   Diagnostic tests X-ray 01/01/16- healing well.     Patient Stated Goals reduce Rt arm pain, improve use and AROM of the Rt arm   Currently in Pain? No/denies                         [facility] Adult PT Treatment/Exercise - 02/20/16 0001    Bed Mobility   Bed Mobility --  pt has pulley's and foam roll at home   Posture/Postural Control   Posture/Postural Control No significant limitations   Exercises   Exercises Shoulder   Shoulder Exercises: Supine   Other Supine Exercises Foam Roll x8min, Overhead flexion 3# added, snowangel, pro/ret each 2 x10   Shoulder Exercises: Standing   Row Strengthening;20 reps  25#, and 3# for pull ups bil.    Other Standing Exercises wallclock for stretching Rt UE, reaching up to 12o'clock 2 x10   Other Standing Exercises elevation/depression and abd with short arc in front of mirrow 2 x 10   Shoulder Exercises: Pulleys   Flexion 3 minutes   Shoulder Exercises: ROM/Strengthening   UBE (Upper Arm Bike) Level 2 x 8 (4/4) minutes, sitting on green physioball    Other ROM/Strengthening Exercises chestpress 12# 2 x10  horizontal/vertical grip   Modalities   Modalities Moist Heat   Moist Heat Therapy   Number Minutes Moist Heat 10 Minutes   Moist Heat Location Shoulder   Manual Therapy   Manual Therapy Soft tissue mobilization   Manual therapy comments Sidelying left scapula PNF   Soft tissue mobilization to rhomboids and subscapularis                  PT Short Term Goals - 02/03/16 1220    PT SHORT TERM GOAL #1   Title be independent in initial HEP   Time 4   Period Weeks   Status Achieved   PT SHORT TERM GOAL #2   Title demonstrate Rt shoulder AROM flexion to 90 degrees to improve use with reaching into  cabinet   Time 4   Period Weeks   Status Achieved   PT SHORT TERM GOAL #3   Title report < or = to 3/10 Rt shoulder pain with use   Time 4   Period Weeks   Status Achieved   PT SHORT TERM GOAL #4   Title demonstrate Rt shoulder PROM ER to > or = to 45 degrees to improve use with fixing hair   Time 4   Period Weeks   Status Achieved           PT Long Term Goals - 02/17/16 1129    PT LONG TERM GOAL #1   Title be independent in advanced HEP   Time 8   Period Weeks   Status On-going   PT LONG TERM GOAL #2   Title reduce FOTO to < or = to 34% limitation   Time 8   Period Weeks   Status On-going   PT LONG TERM GOAL #3   Title demonstrate Rt shoulder AROM flexion to > or = to 110 degrees to improve reaching overhead   Time 8   Period Weeks   Status Achieved   PT LONG  TERM GOAL #4   Title demonstrate Rt shoulder PROM ER to > or = to 55 degrees   Time 8   Period Weeks   Status On-going   PT LONG TERM GOAL #5   Title demonstrate Rt shoulder AROM IR to > or = to L3 to improve self-care   Time 8   Period Weeks   Status On-going   PT LONG TERM GOAL #6   Title report < or  = to 2/10 Rt shoulder pain with use at work   Time 8   Period Weeks   Status On-going               Plan - 02/20/16 0902    Clinical Impression Statement Pt with tenderness posterior capsule and TP rhomboids, pt with weakness with strengthening activities only able to push 12# with chest press. Pt will continue to benefit from skilled PT to improve ROM, strength, and endurance   Rehab Potential Good   Clinical Impairments Affecting Rehab Potential Rt humerus fracture ORIF performed 11/22/2015   PT Frequency 2x / week   PT Duration 6 weeks   PT Treatment/Interventions ADLs/Self Care Home Management;Cryotherapy;Electrical Stimulation;Moist Heat;Therapeutic exercise;Therapeutic activities;Ultrasound;Neuromuscular re-education;Patient/family education;Manual techniques;Taping;Dry needling;Passive range of motion;Scar mobilization   PT Next Visit Plan Pt has Re-evalution next week, plan is to extend for 3-4 more weeks. Continue with Foam Roll, ROM, strength, reaching overhead, STW and PROM for subscapulari, rhomboid release and triceps   Consulted and Agree with Plan of Care Patient      Patient will benefit from skilled therapeutic intervention in order to improve the following deficits and impairments:  Decreased strength, Hypomobility, Impaired flexibility, Pain, Decreased activity tolerance, Impaired UE functional use, Decreased range of motion  Visit Diagnosis: Muscle weakness (generalized)  Stiffness of right shoulder, not elsewhere classified  Pain in right shoulder     Problem List Patient Active Problem List   Diagnosis Date Noted  . Fracture of humerus,  proximal, right, closed 11/22/2015  . Fracture, humerus, proximal 11/22/2015    Shayma Pfefferle Naumann-Houegnifio, PTA 02/20/2016 10:17 AM   [facility] [address], [place], [place], [address] Phone: [phone]   Fax:  [phone]  Name: MONTI VILLERS MRN: [id number] Date of Birth: 1946/06/04

## 2016-02-24 ENCOUNTER — Encounter: Payer: Self-pay | Admitting: Physical Therapy

## 2016-02-24 ENCOUNTER — Ambulatory Visit: Payer: Managed Care, Other (non HMO) | Admitting: Physical Therapy

## 2016-02-24 DIAGNOSIS — M25511 Pain in right shoulder: Secondary | ICD-10-CM

## 2016-02-24 DIAGNOSIS — M25611 Stiffness of right shoulder, not elsewhere classified: Secondary | ICD-10-CM

## 2016-02-24 DIAGNOSIS — M6281 Muscle weakness (generalized): Secondary | ICD-10-CM

## 2016-02-24 NOTE — Therapy (Signed)
[facility] [address], [place], [place], [address] Phone: [phone]   Fax:  [phone]  Physical Therapy Treatment  Patient Details  Name: Cathy Tucker MRN: [id number] Date of Birth: Dec 30, 1945 Referring Provider: Davene Costain  Encounter Date: 02/24/2016      PT End of Session - 02/24/16 1112    Visit Number 14   Date for PT Re-Evaluation 03/03/16   PT Start Time 1100   PT Stop Time 1200   PT Time Calculation (min) 60 min   Activity Tolerance Patient tolerated treatment well   Behavior During Therapy [facility] for tasks assessed/performed      Past Medical History  Diagnosis Date  . Fracture of humerus, proximal, right, closed 11/22/2015    Past Surgical History  Procedure Laterality Date  . Abdominal hysterectomy    . Carpal tunnel with cubital tunnel    . Hand surgery    . Orif humerus fracture Right 11/22/2015    Procedure: OPEN REDUCTION INTERNAL FIXATION (ORIF) RIGHT PROXIMAL HUMERUS FRACTURE;  Surgeon: Marchia Bond, MD;  Location: [facility];  Service: Orthopedics;  Laterality: Right;    There were no vitals filed for this visit.      Subjective Assessment - 02/24/16 1111    Subjective Pt with sorness in Rt shoulder, due to painitng a room this weekend. However, still weakness and unable to lift plate from [place],    Pertinent History ORIF Rt humerus 11/22/15   Diagnostic tests X-ray 01/01/16- healing well.     Patient Stated Goals reduce Rt arm pain, improve use and AROM of the Rt arm   Currently in Pain? No/denies                         [facility] Adult PT Treatment/Exercise - 02/24/16 0001    Bed Mobility   Bed Mobility --  pt has pulley's and foam roll at home   Posture/Postural Control   Posture/Postural Control No significant limitations   Exercises   Exercises Shoulder   Shoulder Exercises: Supine   Other Supine Exercises Foam Roll x91mn, Overhead  flexion 3# added, snowangel, pro/ret each 2 x10   Shoulder Exercises: Standing   Row Strengthening;20 reps  25#, 4 # for pull ups' bil   Other Standing Exercises wallclock for stretching Rt UE, reaching up to 12o'clock 2 x10   Other Standing Exercises elevation/depression and abd with short arc in front of mirrow 2 x 10   Shoulder Exercises: Pulleys   Flexion 3 minutes   Shoulder Exercises: ROM/Strengthening   UBE (Upper Arm Bike) Level 2 x 8 (4/4) minutes, sitting on green physioball    Other ROM/Strengthening Exercises chestpress 12# 2 x10  hotiontal/vertical grip   Modalities   Modalities Moist Heat   Moist Heat Therapy   Number Minutes Moist Heat 10 Minutes   Moist Heat Location Shoulder   Manual Therapy   Manual Therapy Soft tissue mobilization   Manual therapy comments Sidelying left scapula PNF, and supine posterior capsule release   Soft tissue mobilization to rhomboids and subscapularis                  PT Short Term Goals - 02/03/16 1220    PT SHORT TERM GOAL #1   Title be independent in initial HEP   Time 4   Period Weeks   Status Achieved   PT SHORT TERM GOAL #2   Title demonstrate Rt  shoulder AROM flexion to 90 degrees to improve use with reaching into cabinet   Time 4   Period Weeks   Status Achieved   PT SHORT TERM GOAL #3   Title report < or = to 3/10 Rt shoulder pain with use   Time 4   Period Weeks   Status Achieved   PT SHORT TERM GOAL #4   Title demonstrate Rt shoulder PROM ER to > or = to 45 degrees to improve use with fixing hair   Time 4   Period Weeks   Status Achieved           PT Long Term Goals - 02/17/16 1129    PT LONG TERM GOAL #1   Title be independent in advanced HEP   Time 8   Period Weeks   Status On-going   PT LONG TERM GOAL #2   Title reduce FOTO to < or = to 34% limitation   Time 8   Period Weeks   Status On-going   PT LONG TERM GOAL #3   Title demonstrate Rt shoulder AROM flexion to > or = to 110 degrees to  improve reaching overhead   Time 8   Period Weeks   Status Achieved   PT LONG TERM GOAL #4   Title demonstrate Rt shoulder PROM ER to > or = to 55 degrees   Time 8   Period Weeks   Status On-going   PT LONG TERM GOAL #5   Title demonstrate Rt shoulder AROM IR to > or = to L3 to improve self-care   Time 8   Period Weeks   Status On-going   PT LONG TERM GOAL #6   Title report < or  = to 2/10 Rt shoulder pain with use at work   Time 8   Period Weeks   Status On-going               Plan - 02/24/16 1113    Clinical Impression Statement Pt continues with TP on rhomboids and tenderness posterior capsule, continues with weakness in Rt shoulder, chestpress 12# only. Pt will continue to benefit from skilled PT to improve ROM, strength and endurance.     Rehab Potential Good   Clinical Impairments Affecting Rehab Potential Rt humerus fracture ORIF performed 11/22/2015   PT Frequency 2x / week   PT Duration 6 weeks   PT Treatment/Interventions ADLs/Self Care Home Management;Cryotherapy;Electrical Stimulation;Moist Heat;Therapeutic exercise;Therapeutic activities;Ultrasound;Neuromuscular re-education;Patient/family education;Manual techniques;Taping;Dry needling;Passive range of motion;Scar mobilization   PT Next Visit Plan Re-evalution next visit, plan is to extend for 3-4 more weeks. Continue with Foam Roll, ROM, strength, reaching overhead, STW and PROM for subscapulari, rhomboid release and triceps   Consulted and Agree with Plan of Care Patient      Patient will benefit from skilled therapeutic intervention in order to improve the following deficits and impairments:  Decreased strength, Hypomobility, Impaired flexibility, Pain, Decreased activity tolerance, Impaired UE functional use, Decreased range of motion  Visit Diagnosis: Muscle weakness (generalized)  Stiffness of right shoulder, not elsewhere classified  Pain in right shoulder     Problem List Patient Active  Problem List   Diagnosis Date Noted  . Fracture of humerus, proximal, right, closed 11/22/2015  . Fracture, humerus, proximal 11/22/2015    NAUMANN-HOUEGNIFIO,Miette Molenda PTA 02/24/2016, 11:45 AM  [facility] [address] [place], [place], [address] Phone: [phone]   Fax:  [phone]  Name: GALENA LOGIE MRN: [id number]  Date of Birth: 08/22/1946

## 2016-02-26 ENCOUNTER — Ambulatory Visit: Payer: Managed Care, Other (non HMO)

## 2016-02-26 DIAGNOSIS — M25611 Stiffness of right shoulder, not elsewhere classified: Secondary | ICD-10-CM

## 2016-02-26 DIAGNOSIS — M25511 Pain in right shoulder: Secondary | ICD-10-CM

## 2016-02-26 DIAGNOSIS — M6281 Muscle weakness (generalized): Secondary | ICD-10-CM

## 2016-02-26 NOTE — Therapy (Signed)
[facility] [address] [place], [place], [address] Phone: [phone]   Fax:  [phone]  Physical Therapy Treatment  Patient Details  Name: Cathy Tucker MRN: [id number] Date of Birth: December 30, 1945 Referring Provider: Davene Costain  Encounter Date: 02/26/2016      PT End of Session - 02/26/16 1139    Visit Number 15   Date for PT Re-Evaluation 04/03/16   PT Start Time 1100   PT Stop Time 1143   PT Time Calculation (min) 43 min   Activity Tolerance Patient tolerated treatment well   Behavior During Therapy [facility] for tasks assessed/performed      Past Medical History  Diagnosis Date  . Fracture of humerus, proximal, right, closed 11/22/2015    Past Surgical History  Procedure Laterality Date  . Abdominal hysterectomy    . Carpal tunnel with cubital tunnel    . Hand surgery    . Orif humerus fracture Right 11/22/2015    Procedure: OPEN REDUCTION INTERNAL FIXATION (ORIF) RIGHT PROXIMAL HUMERUS FRACTURE;  Surgeon: Marchia Bond, MD;  Location: [place];  Service: Orthopedics;  Laterality: Right;    There were no vitals filed for this visit.      Subjective Assessment - 02/26/16 1119    Subjective Rt shoulder remains weak and pt reports difficulty with overhead activity   Pertinent History ORIF Rt humerus 11/22/15   Diagnostic tests X-ray 01/01/16- healing well.     Patient Stated Goals reduce Rt arm pain, improve use and AROM of the Rt arm   Currently in Pain? Yes   Pain Score 2    Pain Location Shoulder   Pain Orientation Right   Pain Descriptors / Indicators Aching;Sore   Pain Type Surgical pain   Pain Onset More than a month ago   Pain Frequency Intermittent   Aggravating Factors  overhead use, work   Pain Relieving Factors rest, pain medication            [facility] PT Assessment - 02/26/16 0001    Assessment   Medical Diagnosis ORIF Rt prox. humerus fracture   Onset Date/Surgical  Date 11/15/15   Hand Dominance Right   Precautions   Precautions None;Other (comment)  osteopenia   Prior Function   Level of Independence Independent   Vocation Full time employment   Cognition   Overall Cognitive Status Within Functional Limits for tasks assessed   Observation/Other Assessments   Focus on Therapeutic Outcomes (FOTO)  45% limitation   Posture/Postural Control   Posture/Postural Control No significant limitations   ROM / Strength   AROM / PROM / Strength AROM;Strength   AROM   Right/Left Shoulder Right   Right Shoulder Flexion 125 Degrees   Right Shoulder ABduction 105 Degrees   Right Shoulder Internal Rotation --  to T12   Right Shoulder External Rotation --  to T2   Strength   Overall Strength Deficits   Strength Assessment Site Shoulder   Right/Left Shoulder Right   Right Shoulder Flexion 4+/5   Right Shoulder ABduction 4/5   Right Shoulder Internal Rotation 4+/5   Right Shoulder External Rotation 4/5                     [facility] Adult PT Treatment/Exercise - 02/26/16 0001    Shoulder Exercises: Supine   Other Supine Exercises Foam Roll x60mn, Overhead flexion 3# added, snowangel, pro/ret each 2 x10   Shoulder Exercises: Standing   Row Strengthening;20  reps  25#   Shoulder Elevation Limitations cone stack to 2nd shelf :1# added 2x1 minute   Other Standing Exercises wallclock for stretching Rt UE, reaching up to 12o'clock 2 x10   Shoulder Exercises: Pulleys   Flexion 3 minutes   Shoulder Exercises: ROM/Strengthening   UBE (Upper Arm Bike) --   Other ROM/Strengthening Exercises chestpress 12# 2 x10  hotiontal/vertical grip   Other ROM/Strengthening Exercises [place] 4 ways with red band 2x10 each                  PT Short Term Goals - 02/03/16 1220    PT SHORT TERM GOAL #1   Title be independent in initial HEP   Time 4   Period Weeks   Status Achieved   PT SHORT TERM GOAL #2   Title demonstrate Rt shoulder AROM flexion  to 90 degrees to improve use with reaching into cabinet   Time 4   Period Weeks   Status Achieved   PT SHORT TERM GOAL #3   Title report < or = to 3/10 Rt shoulder pain with use   Time 4   Period Weeks   Status Achieved   PT SHORT TERM GOAL #4   Title demonstrate Rt shoulder PROM ER to > or = to 45 degrees to improve use with fixing hair   Time 4   Period Weeks   Status Achieved           PT Long Term Goals - 02/26/16 1113    PT LONG TERM GOAL #1   Title be independent in advanced HEP   Time 5   Period Weeks   Status On-going   PT LONG TERM GOAL #2   Title reduce FOTO to < or = to 34% limitation   Time 5   Period Weeks   Status On-going   PT LONG TERM GOAL #3   Title demonstrate Rt shoulder AROM flexion to > or = to 110 degrees to improve reaching overhead   Time 5   Status Achieved   PT LONG TERM GOAL #4   Title demonstrate Rt shoulder PROM ER to > or = to 55 degrees   Time 5   Period Weeks   Status On-going   PT LONG TERM GOAL #5   Title demonstrate Rt shoulder AROM IR to > or = to L3 to improve self-care   Status Achieved   Additional Long Term Goals   Additional Long Term Goals Yes   PT LONG TERM GOAL #6   Title report < or  = to 2/10 Rt shoulder pain with use at work   Status Achieved   PT LONG TERM GOAL #7   Title demonstrate 4+/5 Rt shoulder strength in increase ease with overhead tasks including fixing hair and lifting objects out of cabinet   Time 5   Period Weeks   Status New   PT LONG TERM GOAL #8   Title report 50% increased ease with reaching across the body with self-care tasks   Time 5   Period Weeks   Status New               Plan - 02/26/16 1120    Clinical Impression Statement Pt with continued weakness in Rt UE but this is improved from evaluation.  FOTO is 45% limitation today.  Pt with improved AROM in all directions.  She reports difficulty with activity overhead and reaching across the body.  Pt will benefit from  continued  skilled PT for 4-5 more weeks to emphasize end range AROM and strength/endurance progression to imrpove ease with use of Rt UE for endurance tasks and ADLs/self-care   Rehab Potential Good   Clinical Impairments Affecting Rehab Potential Rt humerus fracture ORIF performed 11/22/2015   PT Frequency 2x / week   PT Duration 6 weeks   PT Treatment/Interventions ADLs/Self Care Home Management;Cryotherapy;Electrical Stimulation;Moist Heat;Therapeutic exercise;Therapeutic activities;Ultrasound;Neuromuscular re-education;Patient/family education;Manual techniques;Taping;Dry needling;Passive range of motion;Scar mobilization   PT Next Visit Plan continue Rt shoulder strength, endurance and AROM progression   Consulted and Agree with Plan of Care Patient      Patient will benefit from skilled therapeutic intervention in order to improve the following deficits and impairments:  Decreased strength, Hypomobility, Impaired flexibility, Pain, Decreased activity tolerance, Impaired UE functional use, Decreased range of motion  Visit Diagnosis: Muscle weakness (generalized) - Plan: PT plan of care cert/re-cert  Stiffness of right shoulder, not elsewhere classified - Plan: PT plan of care cert/re-cert  Pain in right shoulder - Plan: PT plan of care cert/re-cert     Problem List Patient Active Problem List   Diagnosis Date Noted  . Fracture of humerus, proximal, right, closed 11/22/2015  . Fracture, humerus, proximal 11/22/2015    Cathy Tucker, PT 02/26/2016 11:41 AM  [facility] [address], [place], [place], [address] Phone: [phone]   Fax:  [phone]  Name: Cathy Tucker MRN: [id number] Date of Birth: [birth year]

## 2016-03-02 ENCOUNTER — Ambulatory Visit: Payer: Medicare Other

## 2016-03-04 ENCOUNTER — Ambulatory Visit: Payer: Managed Care, Other (non HMO) | Attending: Orthopedic Surgery

## 2016-03-04 DIAGNOSIS — M25511 Pain in right shoulder: Secondary | ICD-10-CM | POA: Diagnosis present

## 2016-03-04 DIAGNOSIS — M6281 Muscle weakness (generalized): Secondary | ICD-10-CM

## 2016-03-04 DIAGNOSIS — M25611 Stiffness of right shoulder, not elsewhere classified: Secondary | ICD-10-CM

## 2016-03-04 NOTE — Therapy (Signed)
[facility] [address], [place], [place], [address] Phone: [phone]   Fax:  [phone]  Physical Therapy Treatment  Patient Details  Name: Cathy Tucker MRN: [id number] Date of Birth: 25-Jun-1946 Referring Provider: Davene Costain  Encounter Date: 03/04/2016      PT End of Session - 03/04/16 1145    Visit Number 16   Number of Visits 20   Date for PT Re-Evaluation 2016/05/03   Authorization Type G-codes at visit 71, KX MODIFIER NEEDED   PT Start Time 1100   PT Stop Time 1143   PT Time Calculation (min) 43 min   Activity Tolerance Patient tolerated treatment well   Behavior During Therapy [facility] for tasks assessed/performed      Past Medical History  Diagnosis Date  . Fracture of humerus, proximal, right, closed 11/22/2015    Past Surgical History  Procedure Laterality Date  . Abdominal hysterectomy    . Carpal tunnel with cubital tunnel    . Hand surgery    . Orif humerus fracture Right 11/22/2015    Procedure: OPEN REDUCTION INTERNAL FIXATION (ORIF) RIGHT PROXIMAL HUMERUS FRACTURE;  Surgeon: Marchia Bond, MD;  Location: [place];  Service: Orthopedics;  Laterality: Right;    There were no vitals filed for this visit.      Subjective Assessment - 03/04/16 1105    Subjective No pain today.  Played cornhole over the weekend and arm was sore.     Currently in Pain? No/denies                         [facility] Adult PT Treatment/Exercise - 03/04/16 0001    Shoulder Exercises: Supine   Other Supine Exercises Foam Roll x50min, Overhead flexion 3# added, overhead flexion with yellow band then abduction at the top   Shoulder Exercises: Standing   Row Strengthening;20 reps  25#   Shoulder Elevation Limitations cone stack to 2nd shelf :1# added 2x1 minute   Other Standing Exercises wallclock for stretching Rt UE, reaching up to 12o'clock 2 x10   Shoulder Exercises: Pulleys   Flexion  3 minutes   Shoulder Exercises: ROM/Strengthening   UBE (Upper Arm Bike) Level 2 x 8 (4/4) minutes, sitting on green physioball    Other ROM/Strengthening Exercises chestpress 12# 2 x10  hotiontal/vertical grip   Manual Therapy   Manual Therapy Passive ROM;Joint mobilization   Manual therapy comments Rt glenohumeral joint into all directions to tolerance                  PT Short Term Goals - 02/03/16 1220    PT SHORT TERM GOAL #1   Title be independent in initial HEP   Time 4   Period Weeks   Status Achieved   PT SHORT TERM GOAL #2   Title demonstrate Rt shoulder AROM flexion to 90 degrees to improve use with reaching into cabinet   Time 4   Period Weeks   Status Achieved   PT SHORT TERM GOAL #3   Title report < or = to 3/10 Rt shoulder pain with use   Time 4   Period Weeks   Status Achieved   PT SHORT TERM GOAL #4   Title demonstrate Rt shoulder PROM ER to > or = to 45 degrees to improve use with fixing hair   Time 4   Period Weeks   Status Achieved  PT Long Term Goals - 03/04/16 1108    PT LONG TERM GOAL #1   Title be independent in advanced HEP   Time 5   Period Weeks   Status On-going   PT LONG TERM GOAL #2   Title reduce FOTO to < or = to 34% limitation   Time 5   Period Weeks   Status On-going   PT LONG TERM GOAL #3   Title demonstrate Rt shoulder AROM flexion to > or = to 110 degrees to improve reaching overhead   Status Achieved   PT LONG TERM GOAL #5   Title demonstrate Rt shoulder AROM IR to > or = to L3 to improve self-care   Status Achieved   PT LONG TERM GOAL #8   Title report 50% increased ease with reaching across the body with self-care tasks   Time 5   Period Weeks   Status On-going               Plan - 03/04/16 1109    Clinical Impression Statement Pt with continued weakness in Rt UE but this is improved from evaluation.  FOTO was 45% limitation last session.  Pt with diffiuclty with activity ovehread and  reaching across the body.  Pt with benefit from skilled PT for Rt shoulder strengthand endurance progression for ADLs and self-care   Rehab Potential Good   Clinical Impairments Affecting Rehab Potential Rt humerus fracture ORIF performed 11/22/2015   PT Frequency 2x / week   PT Treatment/Interventions ADLs/Self Care Home Management;Cryotherapy;Electrical Stimulation;Moist Heat;Therapeutic exercise;Therapeutic activities;Ultrasound;Neuromuscular re-education;Patient/family education;Manual techniques;Taping;Dry needling;Passive range of motion;Scar mobilization   PT Next Visit Plan continue Rt shoulder strength, endurance and AROM progression   Consulted and Agree with Plan of Care Patient      Patient will benefit from skilled therapeutic intervention in order to improve the following deficits and impairments:  Decreased strength, Hypomobility, Impaired flexibility, Pain, Decreased activity tolerance, Impaired UE functional use, Decreased range of motion  Visit Diagnosis: Muscle weakness (generalized)  Stiffness of right shoulder, not elsewhere classified  Pain in right shoulder     Problem List Patient Active Problem List   Diagnosis Date Noted  . Fracture of humerus, proximal, right, closed 11/22/2015  . Fracture, humerus, proximal 11/22/2015   Sigurd Sos, PT 03/04/2016 11:58 AM  [facility] [address] [place], [place], [address] Phone: [phone]   Fax:  [phone]  Name: Cathy Tucker MRN: [id number] Date of Birth: 13-Jan-1946

## 2016-03-10 ENCOUNTER — Encounter: Payer: Medicare Other | Admitting: Physical Therapy

## 2016-03-16 ENCOUNTER — Encounter: Payer: Medicare Other | Admitting: Physical Therapy

## 2016-03-19 ENCOUNTER — Encounter: Payer: Self-pay | Admitting: Physical Therapy

## 2016-03-19 ENCOUNTER — Ambulatory Visit: Payer: Managed Care, Other (non HMO) | Admitting: Physical Therapy

## 2016-03-19 DIAGNOSIS — M25611 Stiffness of right shoulder, not elsewhere classified: Secondary | ICD-10-CM

## 2016-03-19 DIAGNOSIS — M6281 Muscle weakness (generalized): Secondary | ICD-10-CM

## 2016-03-19 DIAGNOSIS — M25511 Pain in right shoulder: Secondary | ICD-10-CM

## 2016-03-19 NOTE — Therapy (Signed)
[facility] [address], [address] [place], [place], [address] Phone: [phone]   Fax:  [phone]  Physical Therapy Treatment  Patient Details  Name: Cathy Tucker MRN: [id number] Date of Birth: June 19, 1946 Referring Provider: Hardin Negus  Encounter Date: 03/19/2016      PT End of Session - 03/19/16 1714    Visit Number 17   Number of Visits 20   Date for PT Re-Evaluation Apr 09, 2016   Authorization Type G-codes at visit 20, KX MODIFIER NEEDED   PT Start Time 1615   PT Stop Time 1701   PT Time Calculation (min) 46 min   Activity Tolerance Patient limited by fatigue  recovering from shingles   Behavior During Therapy [facility] for tasks assessed/performed      Past Medical History  Diagnosis Date  . Fracture of humerus, proximal, right, closed 11/22/2015    Past Surgical History  Procedure Laterality Date  . Abdominal hysterectomy    . Carpal tunnel with cubital tunnel    . Hand surgery    . Orif humerus fracture Right 11/22/2015    Procedure: OPEN REDUCTION INTERNAL FIXATION (ORIF) RIGHT PROXIMAL HUMERUS FRACTURE;  Surgeon: Teryl Lucy, MD;  Location: [facility];  Service: Orthopedics;  Laterality: Right;    There were no vitals filed for this visit.      Subjective Assessment - 03/19/16 1628    Subjective Pt is recovering from shingles on left upper thoracic. Rt shoulder no complain of pain, but still li mitations with doing the hair, reaching into cabinets, crossing over the body, lifting things   Pertinent History ORIF Rt humerus 11/22/15   Diagnostic tests X-ray 01/01/16- healing well.     Patient Stated Goals reduce Rt arm pain, improve use and AROM of the Rt arm   Currently in Pain? No/denies            [facility] PT Assessment - 03/19/16 0001    Assessment   Medical Diagnosis ORIF Rt prox. humerus fracture   Onset Date/Surgical Date 11/15/15   Hand Dominance Right   Precautions   Precautions  None;Other (comment)  osteopenia   Prior Function   Level of Independence Independent   Vocation Full time employment   Cognition   Overall Cognitive Status Within Functional Limits for tasks assessed   Observation/Other Assessments   Focus on Therapeutic Outcomes (FOTO)  As of 4/26/  45% limitations   Posture/Postural Control   Posture/Postural Control No significant limitations   ROM / Strength   AROM / PROM / Strength AROM;Strength   AROM   Right/Left Shoulder Right   Right Shoulder Flexion 145 Degrees   Right Shoulder ABduction 140 Degrees   Right Shoulder Internal Rotation --  T 12   Right Shoulder External Rotation --  T2 quality of movement improved   Strength   Overall Strength Deficits   Strength Assessment Site Shoulder   Right/Left Shoulder Right   Right Shoulder Flexion 4+/5   Right Shoulder ABduction 4/5   Right Shoulder Internal Rotation 4+/5   Right Shoulder External Rotation 4/5                     [facility] Adult PT Treatment/Exercise - 03/19/16 0001    Exercises   Exercises Shoulder   Shoulder Exercises: Supine   Other Supine Exercises Foam Roll x53min, Overhead flexion 3# added, overhead flexion, snowangel x2x 10   Shoulder Exercises: Standing   Shoulder Elevation Limitations cone stack  to 2nd shelf :1# added 2x1 minute   Other Standing Exercises wallclock for stretching Rt UE, reaching up to 12o'clock 2 x10   Shoulder Exercises: Pulleys   Flexion 3 minutes   Shoulder Exercises: ROM/Strengthening   UBE (Upper Arm Bike) Level 2 x 8 (4/4) minutes, sitting on green physioball    Manual Therapy   Manual Therapy Passive ROM;Joint mobilization  humerus caudal/post glide & Rt scapular mob in Lt sidelying   Manual therapy comments Rt glenohumeral joint into all directions to tolerance                  PT Short Term Goals - 02/03/16 1220    PT SHORT TERM GOAL #1   Title be independent in initial HEP   Time 4   Period Weeks   Status  Achieved   PT SHORT TERM GOAL #2   Title demonstrate Rt shoulder AROM flexion to 90 degrees to improve use with reaching into cabinet   Time 4   Period Weeks   Status Achieved   PT SHORT TERM GOAL #3   Title report < or = to 3/10 Rt shoulder pain with use   Time 4   Period Weeks   Status Achieved   PT SHORT TERM GOAL #4   Title demonstrate Rt shoulder PROM ER to > or = to 45 degrees to improve use with fixing hair   Time 4   Period Weeks   Status Achieved           PT Long Term Goals - 03/19/16 1721    PT LONG TERM GOAL #1   Title be independent in advanced HEP   Time 5   Period Weeks   Status On-going   PT LONG TERM GOAL #2   Title reduce FOTO to < or = to 34% limitation   Time 5   Period Weeks   Status On-going   PT LONG TERM GOAL #3   Title demonstrate Rt shoulder AROM flexion to > or = to 110 degrees to improve reaching overhead   Time 5   Period Weeks   Status Achieved   PT LONG TERM GOAL #4   Title demonstrate Rt shoulder PROM ER to > or = to 55 degrees   Time 5   Period Weeks   Status Partially Met   PT LONG TERM GOAL #5   Title demonstrate Rt shoulder AROM IR to > or = to L3 to improve self-care   Time 8   Period Weeks   Status Achieved   PT LONG TERM GOAL #6   Title report < or  = to 2/10 Rt shoulder pain with use at work   Time 8   Period Weeks   Status Achieved   PT LONG TERM GOAL #7   Title demonstrate 4+/5 Rt shoulder strength in increase ease with overhead tasks including fixing hair and lifting objects out of cabinet   Time 5   Period Weeks   Status Partially Met   PT LONG TERM GOAL #8   Title report 50% increased ease with reaching across the body with self-care tasks   Time 5   Period Weeks   Status On-going               Plan - 03/19/16 1715    Clinical Impression Statement Pt with decreased ROM right shoulder and limited strength. AROM in degrees in right shoulder flexion 145, abduction 140, IR Th12, ER Th 2. Strength  grossly 4/5 to 4+/5. Pt had laps in treatment due to shingles and she will continue to benefit from skilled PT to improve ROM, stength and endurance Rt shoulder.    Rehab Potential Good   Clinical Impairments Affecting Rehab Potential Rt humerus fracture ORIF performed 11/22/2015   PT Frequency 2x / week   PT Duration 8 weeks   PT Treatment/Interventions ADLs/Self Care Home Management;Cryotherapy;Electrical Stimulation;Moist Heat;Therapeutic exercise;Therapeutic activities;Ultrasound;Neuromuscular re-education;Patient/family education;Manual techniques;Taping;Dry needling;Passive range of motion;Scar mobilization   PT Next Visit Plan See what MD has to say on Monday 22nd, 2017.    Consulted and Agree with Plan of Care Patient      Patient will benefit from skilled therapeutic intervention in order to improve the following deficits and impairments:  Decreased strength, Hypomobility, Impaired flexibility, Pain, Decreased activity tolerance, Impaired UE functional use, Decreased range of motion  Visit Diagnosis: Muscle weakness (generalized)  Stiffness of right shoulder, not elsewhere classified  Pain in right shoulder     Problem List Patient Active Problem List   Diagnosis Date Noted  . Fracture of humerus, proximal, right, closed 11/22/2015  . Fracture, humerus, proximal 11/22/2015     Abhimanyu Cruces Naumann-Houegnifio, PTA 03/19/2016 5:25 PM   [facility] [address], [place], [place], [address] Phone: [phone]   Fax:  [phone]  Name: Cathy Tucker MRN: [id number] Date of Birth: September 26, 1946

## 2016-03-24 ENCOUNTER — Ambulatory Visit: Payer: Managed Care, Other (non HMO) | Admitting: Physical Therapy

## 2016-03-24 ENCOUNTER — Encounter: Payer: Self-pay | Admitting: Physical Therapy

## 2016-03-24 DIAGNOSIS — M25611 Stiffness of right shoulder, not elsewhere classified: Secondary | ICD-10-CM

## 2016-03-24 DIAGNOSIS — M6281 Muscle weakness (generalized): Secondary | ICD-10-CM | POA: Diagnosis not present

## 2016-03-24 DIAGNOSIS — M25511 Pain in right shoulder: Secondary | ICD-10-CM

## 2016-03-24 NOTE — Therapy (Signed)
[facility] [address] [place], [place], [address] Phone: [phone]   Fax:  [phone]  Physical Therapy Treatment  Patient Details  Name: Cathy Tucker MRN: [id number] Date of Birth: 1946/03/22 Referring Provider: Davene Costain  Encounter Date: 03/24/2016      PT End of Session - 03/24/16 0858    Visit Number 18   Number of Visits 20   Date for PT Re-Evaluation 2016/04/06   Authorization Type G-codes at visit 72, KX MODIFIER NEEDED   PT Start Time 0847   PT Stop Time 0930   PT Time Calculation (min) 43 min   Activity Tolerance Patient limited by fatigue   Behavior During Therapy [facility] for tasks assessed/performed      Past Medical History  Diagnosis Date  . Fracture of humerus, proximal, right, closed 11/22/2015    Past Surgical History  Procedure Laterality Date  . Abdominal hysterectomy    . Carpal tunnel with cubital tunnel    . Hand surgery    . Orif humerus fracture Right 11/22/2015    Procedure: OPEN REDUCTION INTERNAL FIXATION (ORIF) RIGHT PROXIMAL HUMERUS FRACTURE;  Surgeon: Marchia Bond, MD;  Location: [place];  Service: Orthopedics;  Laterality: Right;    There were no vitals filed for this visit.      Subjective Assessment - 03/24/16 0855    Subjective Pt has no complain of pain in Rt shoulder, but side of shingles on left thoracic area is still painful.    Pertinent History ORIF Rt humerus 11/22/15   Diagnostic tests X-ray 01/01/16- healing well.     Patient Stated Goals reduce Rt arm pain, improve use and AROM of the Rt arm   Currently in Pain? No/denies                         [facility] Adult PT Treatment/Exercise - 03/24/16 0001    Exercises   Exercises Shoulder   Shoulder Exercises: Supine   Other Supine Exercises Foam Roll x1mn, Overhead flexion 3# added, overhead flexion, snowangel x2x 10   Shoulder Exercises: Standing   Row Strengthening;20 reps   25#   Other Standing Exercises wallclock for stretching Rt UE, reaching up to 12o'clock 2 x10   Other Standing Exercises stretching into 12 o'clock x 10 with 3 sec hold   Shoulder Exercises: Pulleys   Flexion 3 minutes   Shoulder Exercises: ROM/Strengthening   UBE (Upper Arm Bike) Level 2 x 8 (4/4) minutes, sitting on green physioball    Other ROM/Strengthening Exercises chestpress 12# 2 x10   Shoulder Exercises: Stretch   Internal Rotation Stretch 3 reps  20sec hold, using towel   Manual Therapy   Manual Therapy Passive ROM;Joint mobilization   Manual therapy comments Rt glenohumeral caudal glide in left sidelying, scapula mob in all directions    Soft tissue mobilization STW to rhomboids, UT, subscapularis with PROM to release capsule and muscle restriction                  PT Short Term Goals - 02/03/16 1220    PT SHORT TERM GOAL #1   Title be independent in initial HEP   Time 4   Period Weeks   Status Achieved   PT SHORT TERM GOAL #2   Title demonstrate Rt shoulder AROM flexion to 90 degrees to improve use with reaching into cabinet   Time 4   Period Weeks   Status  Achieved   PT SHORT TERM GOAL #3   Title report < or = to 3/10 Rt shoulder pain with use   Time 4   Period Weeks   Status Achieved   PT SHORT TERM GOAL #4   Title demonstrate Rt shoulder PROM ER to > or = to 45 degrees to improve use with fixing hair   Time 4   Period Weeks   Status Achieved           PT Long Term Goals - 03/24/16 2725    PT LONG TERM GOAL #1   Title be independent in advanced HEP   Time 5   Period Weeks   Status On-going   PT LONG TERM GOAL #2   Title reduce FOTO to < or = to 34% limitation   Time 5   Period Weeks   Status On-going   PT LONG TERM GOAL #3   Title demonstrate Rt shoulder AROM flexion to > or = to 110 degrees to improve reaching overhead   Time 5   Period Weeks   Status Achieved   PT LONG TERM GOAL #4   Title demonstrate Rt shoulder PROM ER to >  or = to 55 degrees   Time 5   Period Weeks   Status Partially Met   PT LONG TERM GOAL #5   Title demonstrate Rt shoulder AROM IR to > or = to L3 to improve self-care   Time 8   Period Weeks   Status Achieved   PT LONG TERM GOAL #6   Title report < or  = to 2/10 Rt shoulder pain with use at work   Time 8   Period Weeks   Status Achieved   PT LONG TERM GOAL #7   Title demonstrate 4+/5 Rt shoulder strength in increase ease with overhead tasks including fixing hair and lifting objects out of cabinet   Time 5   Period Weeks   Status Partially Met   PT LONG TERM GOAL #8   Title report 50% increased ease with reaching across the body with self-care tasks   Time 5   Period Weeks   Status On-going               Plan - 03/24/16 0900    Clinical Impression Statement Pt with slighly limited and compensatory movement in available ROM Rt shoulder. Pt had laps in treatment due to shingles and she will continue to benefit from skilled PT to improve ROM, strength and endurance Rt shoulder.    Rehab Potential Good   Clinical Impairments Affecting Rehab Potential Rt humerus fracture ORIF performed 11/22/2015   PT Frequency 2x / week   PT Duration 8 weeks   PT Treatment/Interventions ADLs/Self Care Home Management;Cryotherapy;Electrical Stimulation;Moist Heat;Therapeutic exercise;Therapeutic activities;Ultrasound;Neuromuscular re-education;Patient/family education;Manual techniques;Taping;Dry needling;Passive range of motion;Scar mobilization   PT Next Visit Plan Re-evaluate next week for renewal. Pt was not able to see MD last Monday due to work related time restrictions, she has rescheduled for Apr 01, 2016. Pt is traveling to the beach today for 1 week.    Consulted and Agree with Plan of Care Patient      Patient will benefit from skilled therapeutic intervention in order to improve the following deficits and impairments:  Decreased strength, Hypomobility, Impaired flexibility, Pain,  Decreased activity tolerance, Impaired UE functional use, Decreased range of motion  Visit Diagnosis: Muscle weakness (generalized)  Stiffness of right shoulder, not elsewhere classified  Pain in right shoulder  Problem List Patient Active Problem List   Diagnosis Date Noted  . Fracture of humerus, proximal, right, closed 11/22/2015  . Fracture, humerus, proximal 11/22/2015    NAUMANN-HOUEGNIFIO,Syler Norcia PTA 03/24/2016, 9:46 AM  [facility] [address], [place], [place], [address] Phone: [phone]   Fax:  [phone]  Name: Cathy Tucker MRN: [id number] Date of Birth: 1946-10-17

## 2016-03-31 ENCOUNTER — Ambulatory Visit: Payer: Managed Care, Other (non HMO)

## 2016-03-31 DIAGNOSIS — M25511 Pain in right shoulder: Secondary | ICD-10-CM

## 2016-03-31 DIAGNOSIS — M6281 Muscle weakness (generalized): Secondary | ICD-10-CM | POA: Diagnosis not present

## 2016-03-31 DIAGNOSIS — M25611 Stiffness of right shoulder, not elsewhere classified: Secondary | ICD-10-CM

## 2016-03-31 NOTE — Therapy (Signed)
[facility] [address], [place], [place], [address] Phone: [phone]   Fax:  [phone]  Physical Therapy Treatment  Patient Details  Name: Cathy Tucker MRN: [id number] Date of Birth: 11-24-1945 Referring Provider: Davene Costain  Encounter Date: 03/31/2016      PT End of Session - 03/31/16 1614    Visit Number 19   Number of Visits 20   Authorization Type G-codes at visit 20, KX MODIFIER NEEDED   PT Start Time [id number]  late for appt   PT Stop Time 1616   PT Time Calculation (min) 31 min   Activity Tolerance Patient tolerated treatment well   Behavior During Therapy [facility] for tasks assessed/performed      Past Medical History  Diagnosis Date  . Fracture of humerus, proximal, right, closed 11/22/2015    Past Surgical History  Procedure Laterality Date  . Abdominal hysterectomy    . Carpal tunnel with cubital tunnel    . Hand surgery    . Orif humerus fracture Right 11/22/2015    Procedure: OPEN REDUCTION INTERNAL FIXATION (ORIF) RIGHT PROXIMAL HUMERUS FRACTURE;  Surgeon: Marchia Bond, MD;  Location: [place];  Service: Orthopedics;  Laterality: Right;    There were no vitals filed for this visit.      Subjective Assessment - 03/31/16 1549    Subjective Pt played corn hole for 5 hours over the weeekend without limtation.     Pertinent History ORIF Rt humerus 11/22/15   Diagnostic tests X-ray 01/01/16- healing well.     Patient Stated Goals reduce Rt arm pain, improve use and AROM of the Rt arm   Currently in Pain? No/denies            [facility] PT Assessment - 03/31/16 0001    Assessment   Medical Diagnosis ORIF Rt prox. humerus fracture   Onset Date/Surgical Date 11/15/15   Precautions   Precautions None;Other (comment)  osteopenia   Prior Function   Level of Independence Independent   Vocation Full time employment   Cognition   Overall Cognitive Status Within Functional Limits  for tasks assessed   Observation/Other Assessments   Focus on Therapeutic Outcomes (FOTO)  39% limitation   Posture/Postural Control   Posture/Postural Control No significant limitations   AROM   Right/Left Shoulder Right   Right Shoulder Flexion 130 Degrees   Right Shoulder ABduction 125 Degrees   Right Shoulder Internal Rotation --  T12   Right Shoulder External Rotation --  T3   Strength   Strength Assessment Site Shoulder   Right/Left Shoulder Right   Right Shoulder Flexion 4+/5   Right Shoulder ABduction 4/5   Right Shoulder Internal Rotation 4+/5   Right Shoulder External Rotation 4/5                     [facility] Adult PT Treatment/Exercise - 03/31/16 0001    Shoulder Exercises: Supine   Other Supine Exercises Foam Roll x46min, Overhead flexion 3# added, overhead flexion, snowangel x2x 10   Shoulder Exercises: Standing   Row Strengthening;20 reps  25#   Other Standing Exercises wallclock for stretching Rt UE, reaching up to 12o'clock 2 x10   Other Standing Exercises stretching into 12 o'clock x 10 with 3 sec hold   Shoulder Exercises: Pulleys   Flexion 3 minutes   Shoulder Exercises: ROM/Strengthening   UBE (Upper Arm Bike) Level 2 x 8 (4/4) minutes, sitting on green physioball  Other ROM/Strengthening Exercises chestpress 12# 2 x10                  PT Short Term Goals - 02/03/16 1220    PT SHORT TERM GOAL #1   Title be independent in initial HEP   Time 4   Period Weeks   Status Achieved   PT SHORT TERM GOAL #2   Title demonstrate Rt shoulder AROM flexion to 90 degrees to improve use with reaching into cabinet   Time 4   Period Weeks   Status Achieved   PT SHORT TERM GOAL #3   Title report < or = to 3/10 Rt shoulder pain with use   Time 4   Period Weeks   Status Achieved   PT SHORT TERM GOAL #4   Title demonstrate Rt shoulder PROM ER to > or = to 45 degrees to improve use with fixing hair   Time 4   Period Weeks   Status Achieved            PT Long Term Goals - 03/31/16 1550    PT LONG TERM GOAL #1   Title be independent in advanced HEP   Time 5   Period Weeks   Status On-going   PT LONG TERM GOAL #2   Title reduce FOTO to < or = to 34% limitation   Time 5   Period Weeks   Status On-going  39% limitation   PT LONG TERM GOAL #3   Title demonstrate Rt shoulder AROM flexion to > or = to 110 degrees to improve reaching overhead   PT LONG TERM GOAL #4   Title demonstrate Rt shoulder PROM ER to > or = to 55 degrees   Time 5   Period Weeks   Status On-going   PT LONG TERM GOAL #5   Title demonstrate Rt shoulder AROM IR to > or = to L3 to improve self-care   Status Achieved   PT LONG TERM GOAL #6   Title report < or  = to 2/10 Rt shoulder pain with use at work   Status Achieved   PT LONG TERM GOAL #7   Title demonstrate 4+/5 Rt shoulder strength in increase ease with overhead tasks including fixing hair and lifting objects out of cabinet   Time 5   Period Weeks   Status On-going   PT LONG TERM GOAL #8   Title report 50% increased ease with reaching across the body with self-care tasks   Time 5   Period Weeks   Status On-going  35-40% easier               Plan - 03/31/16 1601    Clinical Impression Statement Pt is making steady progress.  Pt with improved AROM of Lt shoulder and strength is improving.  Pt feels limited with lifting overhead and reaching across the body.  Pt reports 30-40% ease with reaching across her body with self-care.  Pt has HEP in place for strength and flexiblity advancement. Pt will see MD tomorrow to discuss progress and need for future PT.     Rehab Potential Good   Clinical Impairments Affecting Rehab Potential Rt humerus fracture ORIF performed 11/22/2015   PT Frequency 2x / week   PT Duration 8 weeks   PT Treatment/Interventions ADLs/Self Care Home Management;Cryotherapy;Electrical Stimulation;Moist Heat;Therapeutic exercise;Therapeutic  activities;Ultrasound;Neuromuscular re-education;Patient/family education;Manual techniques;Taping;Dry needling;Passive range of motion;Scar mobilization   PT Next Visit Plan See what MD says.  Renewal needed  if pt is going to continue.  G-codes for visit 54   Consulted and Agree with Plan of Care Patient      Patient will benefit from skilled therapeutic intervention in order to improve the following deficits and impairments:  Decreased strength, Hypomobility, Impaired flexibility, Pain, Decreased activity tolerance, Impaired UE functional use, Decreased range of motion  Visit Diagnosis: Muscle weakness (generalized)  Stiffness of right shoulder, not elsewhere classified  Pain in right shoulder     Problem List Patient Active Problem List   Diagnosis Date Noted  . Fracture of humerus, proximal, right, closed 11/22/2015  . Fracture, humerus, proximal 11/22/2015     Sigurd Sos, PT 03/31/2016 4:16 PM  [facility] [address], [place], [place], [address] Phone: [phone]   Fax:  [phone]  Name: Cathy Tucker MRN: [id number] Date of Birth: July 09, 1946

## 2016-04-02 ENCOUNTER — Ambulatory Visit: Payer: Managed Care, Other (non HMO) | Attending: Orthopedic Surgery

## 2016-04-02 DIAGNOSIS — M25511 Pain in right shoulder: Secondary | ICD-10-CM

## 2016-04-02 DIAGNOSIS — M25611 Stiffness of right shoulder, not elsewhere classified: Secondary | ICD-10-CM | POA: Diagnosis present

## 2016-04-02 DIAGNOSIS — M6281 Muscle weakness (generalized): Secondary | ICD-10-CM | POA: Diagnosis present

## 2016-04-02 NOTE — Therapy (Signed)
[facility] [address], [place], [place], [address] Phone: [phone]   Fax:  [phone]  Physical Therapy Treatment  Patient Details  Name: Cathy Tucker MRN: [id number] Date of Birth: 24-Aug-1946 Referring Provider: Davene Costain  Encounter Date: 04/02/2016      PT End of Session - 04/02/16 0910    Visit Number 20   PT Start Time 0845   PT Stop Time 0913  final session, no pain   PT Time Calculation (min) 28 min   Activity Tolerance Patient tolerated treatment well   Behavior During Therapy [facility] for tasks assessed/performed      Past Medical History  Diagnosis Date  . Fracture of humerus, proximal, right, closed 11/22/2015    Past Surgical History  Procedure Laterality Date  . Abdominal hysterectomy    . Carpal tunnel with cubital tunnel    . Hand surgery    . Orif humerus fracture Right 11/22/2015    Procedure: OPEN REDUCTION INTERNAL FIXATION (ORIF) RIGHT PROXIMAL HUMERUS FRACTURE;  Surgeon: Marchia Bond, MD;  Location: [place];  Service: Orthopedics;  Laterality: Right;    There were no vitals filed for this visit.      Subjective Assessment - 04/02/16 0848    Subjective Saw MD and he agreed with D/C.     Pertinent History ORIF Rt humerus 11/22/15   Patient Stated Goals reduce Rt arm pain, improve use and AROM of the Rt arm   Currently in Pain? No/denies            [facility] PT Assessment - 04/02/16 0001    Assessment   Medical Diagnosis ORIF Rt prox. humerus fracture   Onset Date/Surgical Date 11/15/15   Hand Dominance Right   Observation/Other Assessments   Focus on Therapeutic Outcomes (FOTO)  39% limitation   AROM   Right/Left Shoulder Right   Right Shoulder Flexion 130 Degrees   Right Shoulder ABduction 125 Degrees   Right Shoulder Internal Rotation --  T12   Right Shoulder External Rotation --  T3   Strength   Strength Assessment Site Shoulder   Right/Left  Shoulder Right   Right Shoulder Flexion 4+/5   Right Shoulder ABduction 4/5   Right Shoulder Internal Rotation 4+/5   Right Shoulder External Rotation 4/5                     [facility] Adult PT Treatment/Exercise - 04/02/16 0001    Shoulder Exercises: Supine   Other Supine Exercises Foam Roll x46mn, Overhead flexion 3# added, overhead flexion, snowangel x2x 10   Shoulder Exercises: Standing   Row Strengthening;20 reps  25#   Other Standing Exercises wallclock for stretching Rt UE, reaching up to 12o'clock 2 x10   Other Standing Exercises stretching into 12 o'clock x 10 with 3 sec hold   Shoulder Exercises: Pulleys   Flexion 3 minutes   Shoulder Exercises: ROM/Strengthening   UBE (Upper Arm Bike) Level 2 x 8 (4/4) minutes, sitting on green physioball    Other ROM/Strengthening Exercises chestpress 12# 2 x10                  PT Short Term Goals - 02/03/16 1220    PT SHORT TERM GOAL #1   Title be independent in initial HEP   Time 4   Period Weeks   Status Achieved   PT SHORT TERM GOAL #2   Title demonstrate Rt shoulder AROM flexion to 90  degrees to improve use with reaching into cabinet   Time 4   Period Weeks   Status Achieved   PT SHORT TERM GOAL #3   Title report < or = to 3/10 Rt shoulder pain with use   Time 4   Period Weeks   Status Achieved   PT SHORT TERM GOAL #4   Title demonstrate Rt shoulder PROM ER to > or = to 45 degrees to improve use with fixing hair   Time 4   Period Weeks   Status Achieved           PT Long Term Goals - Apr 26, 2016 0850    PT LONG TERM GOAL #1   Title be independent in advanced HEP   Status Achieved   PT LONG TERM GOAL #2   Title reduce FOTO to < or = to 34% limitation   Status Partially Met  39% limitation   PT LONG TERM GOAL #3   Title demonstrate Rt shoulder AROM flexion to > or = to 110 degrees to improve reaching overhead   Status Achieved   PT LONG TERM GOAL #4   Title demonstrate Rt shoulder PROM ER to  > or = to 55 degrees   Status Partially Met   PT LONG TERM GOAL #5   Title demonstrate Rt shoulder AROM IR to > or = to L3 to improve self-care   Status Achieved   PT LONG TERM GOAL #6   Title report < or  = to 2/10 Rt shoulder pain with use at work   Status Achieved   PT LONG TERM GOAL #7   Title demonstrate 4+/5 Rt shoulder strength in increase ease with overhead tasks including fixing hair and lifting objects out of cabinet   Status Partially Met   PT LONG TERM GOAL #8   Title report 50% increased ease with reaching across the body with self-care tasks   Status Partially Met               Plan - 04/26/16 0854    Clinical Impression Statement Pt saw MD and he agreed with D/C to HEP.  Pt is limited with reaching overhead and with reaching across the body.  Pt has HEP for continued strength and AROM gains.     PT Next Visit Plan D/C PT to HEP   Consulted and Agree with Plan of Care Patient      Patient will benefit from skilled therapeutic intervention in order to improve the following deficits and impairments:     Visit Diagnosis: Muscle weakness (generalized)  Stiffness of right shoulder, not elsewhere classified  Pain in right shoulder       G-Codes - 04-26-2016 0855    Functional Assessment Tool Used FOTO: 39% limitation   Functional Limitation Other PT primary   Other PT Primary Goal Status (F1102) At least 40 percent but less than 60 percent impaired, limited or restricted   Other PT Primary Discharge Status (T1173) At least 40 percent but less than 60 percent impaired, limited or restricted      Problem List Patient Active Problem List   Diagnosis Date Noted  . Fracture of humerus, proximal, right, closed 11/22/2015  . Fracture, humerus, proximal 11/22/2015   PHYSICAL THERAPY DISCHARGE SUMMARY  Visits from Start of Care: 20  Current functional level related to goals / functional outcomes: See above   Remaining deficits: See above    Education /  Equipment: HEP Plan: Patient agrees to discharge.  Patient goals were partially met. Patient is being discharged due to being pleased with the current functional level.  ?????      Sigurd Sos, PT 04/02/2016 9:11 AM  [facility] [address], [place], [place], [address] Phone: [phone]   Fax:  [phone]  Name: Cathy Tucker MRN: [id number] Date of Birth: Jul 04, 1946

## 2016-09-30 ENCOUNTER — Other Ambulatory Visit: Payer: Self-pay | Admitting: Internal Medicine

## 2016-09-30 DIAGNOSIS — Z1231 Encounter for screening mammogram for malignant neoplasm of breast: Secondary | ICD-10-CM

## 2016-10-01 ENCOUNTER — Ambulatory Visit
Admission: RE | Admit: 2016-10-01 | Discharge: 2016-10-01 | Disposition: A | Discharge status: Home / Self Care | Payer: Managed Care, Other (non HMO) | Source: Ambulatory Visit | Attending: Internal Medicine | Admitting: Internal Medicine

## 2016-10-01 DIAGNOSIS — Z1231 Encounter for screening mammogram for malignant neoplasm of breast: Secondary | ICD-10-CM

## 2017-05-11 DIAGNOSIS — H353131 Nonexudative age-related macular degeneration, bilateral, early dry stage: Secondary | ICD-10-CM | POA: Diagnosis not present

## 2017-06-15 DIAGNOSIS — H6980 Other specified disorders of Eustachian tube, unspecified ear: Secondary | ICD-10-CM | POA: Diagnosis not present

## 2017-06-15 DIAGNOSIS — J029 Acute pharyngitis, unspecified: Secondary | ICD-10-CM | POA: Diagnosis not present

## 2017-06-15 DIAGNOSIS — Z6831 Body mass index (BMI) 31.0-31.9, adult: Secondary | ICD-10-CM | POA: Diagnosis not present

## 2017-06-15 DIAGNOSIS — Z79899 Other long term (current) drug therapy: Secondary | ICD-10-CM | POA: Diagnosis not present

## 2017-08-03 DIAGNOSIS — R300 Dysuria: Secondary | ICD-10-CM | POA: Diagnosis not present

## 2017-08-03 DIAGNOSIS — N390 Urinary tract infection, site not specified: Secondary | ICD-10-CM | POA: Diagnosis not present

## 2017-09-20 DIAGNOSIS — H00035 Abscess of left lower eyelid: Secondary | ICD-10-CM | POA: Diagnosis not present

## 2017-09-30 ENCOUNTER — Other Ambulatory Visit: Payer: Self-pay | Admitting: Internal Medicine

## 2017-09-30 DIAGNOSIS — Z1231 Encounter for screening mammogram for malignant neoplasm of breast: Secondary | ICD-10-CM

## 2017-10-07 DIAGNOSIS — R0781 Pleurodynia: Secondary | ICD-10-CM | POA: Diagnosis not present

## 2017-10-07 DIAGNOSIS — Z6829 Body mass index (BMI) 29.0-29.9, adult: Secondary | ICD-10-CM | POA: Diagnosis not present

## 2017-10-07 DIAGNOSIS — R82998 Other abnormal findings in urine: Secondary | ICD-10-CM | POA: Diagnosis not present

## 2017-10-07 DIAGNOSIS — Z79899 Other long term (current) drug therapy: Secondary | ICD-10-CM | POA: Diagnosis not present

## 2017-10-07 DIAGNOSIS — M859 Disorder of bone density and structure, unspecified: Secondary | ICD-10-CM | POA: Diagnosis not present

## 2017-10-11 ENCOUNTER — Ambulatory Visit: Payer: Managed Care, Other (non HMO)

## 2017-10-15 DIAGNOSIS — Z0000 Encounter for general adult medical examination without abnormal findings: Secondary | ICD-10-CM | POA: Diagnosis not present

## 2017-10-15 DIAGNOSIS — R002 Palpitations: Secondary | ICD-10-CM | POA: Diagnosis not present

## 2017-10-15 DIAGNOSIS — F418 Other specified anxiety disorders: Secondary | ICD-10-CM | POA: Diagnosis not present

## 2017-10-15 DIAGNOSIS — M859 Disorder of bone density and structure, unspecified: Secondary | ICD-10-CM | POA: Diagnosis not present

## 2017-10-15 DIAGNOSIS — Z23 Encounter for immunization: Secondary | ICD-10-CM | POA: Diagnosis not present

## 2017-10-15 DIAGNOSIS — M25561 Pain in right knee: Secondary | ICD-10-CM | POA: Diagnosis not present

## 2017-10-15 DIAGNOSIS — M25562 Pain in left knee: Secondary | ICD-10-CM | POA: Diagnosis not present

## 2017-10-15 DIAGNOSIS — Z6829 Body mass index (BMI) 29.0-29.9, adult: Secondary | ICD-10-CM | POA: Diagnosis not present

## 2017-10-15 DIAGNOSIS — Z1212 Encounter for screening for malignant neoplasm of rectum: Secondary | ICD-10-CM | POA: Diagnosis not present

## 2017-10-15 DIAGNOSIS — E663 Overweight: Secondary | ICD-10-CM | POA: Diagnosis not present

## 2017-10-15 DIAGNOSIS — R0781 Pleurodynia: Secondary | ICD-10-CM | POA: Diagnosis not present

## 2017-10-15 DIAGNOSIS — Z1389 Encounter for screening for other disorder: Secondary | ICD-10-CM | POA: Diagnosis not present

## 2017-10-15 DIAGNOSIS — Z79899 Other long term (current) drug therapy: Secondary | ICD-10-CM | POA: Diagnosis not present

## 2017-10-15 LAB — IFOBT (OCCULT BLOOD): IMMUNOLOGICAL FECAL OCCULT BLOOD TEST: NEGATIVE

## 2017-11-05 DIAGNOSIS — H1013 Acute atopic conjunctivitis, bilateral: Secondary | ICD-10-CM | POA: Diagnosis not present

## 2017-11-10 ENCOUNTER — Ambulatory Visit
Admission: RE | Admit: 2017-11-10 | Discharge: 2017-11-10 | Disposition: A | Discharge status: Home / Self Care | Payer: Medicare Other | Source: Ambulatory Visit | Attending: Internal Medicine | Admitting: Internal Medicine

## 2017-11-10 DIAGNOSIS — Z1231 Encounter for screening mammogram for malignant neoplasm of breast: Secondary | ICD-10-CM

## 2017-12-15 DIAGNOSIS — M170 Bilateral primary osteoarthritis of knee: Secondary | ICD-10-CM | POA: Diagnosis not present

## 2017-12-20 ENCOUNTER — Other Ambulatory Visit: Payer: Self-pay

## 2017-12-20 NOTE — Telephone Encounter (Signed)
Sent referral to scheduling, attached notes to file.

## 2017-12-22 ENCOUNTER — Encounter: Payer: Self-pay | Admitting: Interventional Cardiology

## 2017-12-22 DIAGNOSIS — M170 Bilateral primary osteoarthritis of knee: Secondary | ICD-10-CM | POA: Diagnosis not present

## 2017-12-23 ENCOUNTER — Encounter (INDEPENDENT_AMBULATORY_CARE_PROVIDER_SITE_OTHER): Payer: Self-pay

## 2017-12-23 ENCOUNTER — Ambulatory Visit (INDEPENDENT_AMBULATORY_CARE_PROVIDER_SITE_OTHER): Payer: Medicare Other | Admitting: Interventional Cardiology

## 2017-12-23 ENCOUNTER — Encounter: Payer: Self-pay | Admitting: Interventional Cardiology

## 2017-12-23 VITALS — BP 146/92 | HR 59 | Ht 63.0 in | Wt 172.0 lb

## 2017-12-23 DIAGNOSIS — R002 Palpitations: Secondary | ICD-10-CM | POA: Insufficient documentation

## 2017-12-23 DIAGNOSIS — R0609 Other forms of dyspnea: Secondary | ICD-10-CM | POA: Diagnosis not present

## 2017-12-23 DIAGNOSIS — R0600 Dyspnea, unspecified: Secondary | ICD-10-CM | POA: Insufficient documentation

## 2017-12-23 NOTE — Patient Instructions (Signed)
Medication Instructions:  Your physician recommends that you continue on your current medications as directed. Please refer to the Current Medication list given to you today.  Labwork: None  Testing/Procedures: Your physician has requested that you have an echocardiogram. Echocardiography is a painless test that uses sound waves to create images of your heart. It provides your doctor with information about the size and shape of your heart and how well your heart's chambers and valves are working. This procedure takes approximately one hour. There are no restrictions for this procedure.  Your physician has recommended that you wear an event monitor. Event monitors are medical devices that record the heart's electrical activity. Doctors most often us these monitors to diagnose arrhythmias. Arrhythmias are problems with the speed or rhythm of the heartbeat. The monitor is a small, portable device. You can wear one while you do your normal daily activities. This is usually used to diagnose what is causing palpitations/syncope (passing out).   Follow-Up: Your physician recommends that you schedule a follow-up appointment as needed with Dr. Smith.    Any Other Special Instructions Will Be Listed Below (If Applicable).     If you need a refill on your cardiac medications before your next appointment, please call your pharmacy.   

## 2017-12-23 NOTE — Progress Notes (Signed)
Cardiology Office Note    Date:  12/23/2017   ID:  Gabriele, Zwilling May 07, 1946, MRN [id number]  PCP:  Velna Hatchet, MD  Cardiologist: Sinclair Grooms, MD   Chief Complaint  Patient presents with  . Shortness of Breath  . Palpitations    History of Present Illness:  Cathy Tucker is a 72 y.o. female referred by [facility] for evaluation of palpitations and dyspnea on exertion.  Pleasant and outgoing with concerns about palpitations.  These are characterized as episodes of rapid heart rate lasting 2-3 minutes at a time.  Episodes can occur as frequently as 3 times per week.  May occasionally go longer than a week without episodes.  There is no lightheadedness, dizziness, chest discomfort, or dyspnea associated.  Is never had an episode to last longer than 5 minutes maximum.  Notices dyspnea on exertion especially with incline.  Denies orthopnea and PND.  No peripheral edema.  She denies wheezing.  No history of pulmonary emboli.  When walking up an incline in addition to dyspnea there is some mild burning in the left scapula.  She walks 2-1/2 miles per day.  She is actually walking further now than she was a year ago.  She has had dyspnea for greater than 5 years.  Her endurance is improved over this time.  Past Medical History:  Diagnosis Date  . Allergic rhinitis   . Athlete's foot   . Fracture of humerus, proximal, right, closed 11/22/2015  . Migraines   . Osteopenia   . Situational anxiety     Past Surgical History:  Procedure Laterality Date  . ABDOMINAL HYSTERECTOMY    . CARPAL TUNNEL WITH CUBITAL TUNNEL    . HAND SURGERY    . ORIF HUMERUS FRACTURE Right 11/22/2015   Procedure: OPEN REDUCTION INTERNAL FIXATION (ORIF) RIGHT PROXIMAL HUMERUS FRACTURE;  Surgeon: Marchia Bond, MD;  Location: [facility];  Service: Orthopedics;  Laterality: Right;  . ulnar nerve decompression      Current Medications: Outpatient Medications Prior to Visit  Medication Sig  Dispense Refill  . baclofen (LIORESAL) 10 MG tablet Take 1 tablet (10 mg total) by mouth 3 (three) times daily. As needed for muscle spasm 50 tablet 0  . fexofenadine (ALLEGRA) 180 MG tablet Take 180 mg by mouth daily.    Marland Kitchen BIOGAIA PROBIOTIC (BIOGAIA PROBIOTIC) LIQD Take by mouth daily at 8 pm.    . diazepam (VALIUM) 5 MG tablet Take 5 mg by mouth every 6 (six) hours as needed for anxiety. Reported on 01/07/2016    . fexofenadine-pseudoephedrine (ALLEGRA-D 24) 180-240 MG 24 hr tablet Take 1 tablet by mouth daily.    . ondansetron (ZOFRAN) 4 MG tablet Take 1 tablet (4 mg total) by mouth every 8 (eight) hours as needed for nausea or vomiting. (Patient not taking: Reported on 01/07/2016) 30 tablet 0  . oxyCODONE-acetaminophen (PERCOCET) 10-325 MG tablet Take 1-2 tablets by mouth every 6 (six) hours as needed for pain. MAXIMUM TOTAL ACETAMINOPHEN DOSE IS 4000 MG PER DAY 50 tablet 0  . sennosides-docusate sodium (SENOKOT-S) 8.6-50 MG tablet Take 2 tablets by mouth daily. (Patient not taking: Reported on 01/07/2016) 30 tablet 1   No facility-administered medications prior to visit.      Allergies:   Tetracyclines & related and Penicillins   Social History   Socioeconomic History  . Marital status: Widowed    Spouse name: None  . Number of children: 2  . Years of education:  None  . Highest education level: None  Social Needs  . Financial resource strain: None  . Food insecurity - worry: None  . Food insecurity - inability: None  . Transportation needs - medical: None  . Transportation needs - non-medical: None  Occupational History  . Occupation: adult daycare  Tobacco Use  . Smoking status: Never Smoker  . Smokeless tobacco: Never Used  Substance and Sexual Activity  . Alcohol use: No  . Drug use: No  . Sexual activity: None  Other Topics Concern  . None  Social History Narrative  . None     Family History:  The patient's family history includes Arthritis in her paternal grandmother;  Asthma in her sister; Brain cancer in her father; COPD in her sister; Depression in her sister; Diabetes Mellitus II in her paternal grandmother; Lung cancer in her father.   ROS:   Please see the history of present illness.    Bilateral knee discomfort.  Otherwise unremarkable All other systems reviewed and are negative.   PHYSICAL EXAM:   VS:  BP (!) 146/92   Pulse (!) 59   Ht 5\' 3"  (1.6 m)   Wt 172 lb (78 kg)   BMI 30.47 kg/m    GEN: Well nourished, well developed, in no acute distress  HEENT: normal  Neck: no JVD, carotid bruits, or masses Cardiac: RRR; no murmurs, rubs, or gallops,no edema  Respiratory:  clear to auscultation bilaterally, normal work of breathing GI: soft, nontender, nondistended, + BS MS: no deformity or atrophy  Skin: warm and dry, no rash Neuro:  Alert and Oriented x 3, Strength and sensation are intact Psych: euthymic mood, full affect  Wt Readings from Last 3 Encounters:  12/23/17 172 lb (78 kg)  11/22/15 174 lb (78.9 kg)      Studies/Labs Reviewed:   EKG:  EKG sinus bradycardia at 59 bpm.  QS pattern V1 and V2.  Overall appearance is normal.  Recent Labs: No results found for requested labs within last 8760 hours.   Lipid Panel No results found for: CHOL, TRIG, HDL, CHOLHDL, VLDL, LDLCALC, LDLDIRECT  Additional studies/ records that were reviewed today include:  None    ASSESSMENT:    1. Palpitations   2. Dyspnea on exertion      PLAN:  In order of problems listed above:  1. Rule out atrial fibrillation with 30-day continuous monitor. 2. 2D Doppler echocardiogram to rule out systolic heart failure/diastolic heart failure that may explain the patient's dyspnea on exertion.  Overall doing well.  Possibly some underlying coronary disease but clinically stable if present based upon history of increasing physical activity over the past year.  If echo is normal, no further evaluation unless symptoms worsen with respect to  dyspnea.    Medication Adjustments/Labs and Tests Ordered: Current medicines are reviewed at length with the patient today.  Concerns regarding medicines are outlined above.  Medication changes, Labs and Tests ordered today are listed in the Patient Instructions below. Patient Instructions  Medication Instructions:  Your physician recommends that you continue on your current medications as directed. Please refer to the Current Medication list given to you today.  Labwork: None  Testing/Procedures: Your physician has requested that you have an echocardiogram. Echocardiography is a painless test that uses sound waves to create images of your heart. It provides your doctor with information about the size and shape of your heart and how well your heart's chambers and valves are working. This procedure takes approximately  one hour. There are no restrictions for this procedure.  Your physician has recommended that you wear an event monitor. Event monitors are medical devices that record the heart's electrical activity. Doctors most often [place] these monitors to diagnose arrhythmias. Arrhythmias are problems with the speed or rhythm of the heartbeat. The monitor is a small, portable device. You can wear one while you do your normal daily activities. This is usually used to diagnose what is causing palpitations/syncope (passing out).   Follow-Up: Your physician recommends that you schedule a follow-up appointment as needed with Dr. Tamala Julian.    Any Other Special Instructions Will Be Listed Below (If Applicable).     If you need a refill on your cardiac medications before your next appointment, please call your pharmacy.      Signed, Sinclair Grooms, MD  12/23/2017 2:00 PM    [facility] [address], [place], [place]  [address] Phone: [phone]; Fax: [phone]

## 2017-12-27 DIAGNOSIS — M170 Bilateral primary osteoarthritis of knee: Secondary | ICD-10-CM | POA: Diagnosis not present

## 2018-01-04 ENCOUNTER — Other Ambulatory Visit: Payer: Self-pay

## 2018-01-04 ENCOUNTER — Ambulatory Visit (HOSPITAL_COMMUNITY): Payer: Medicare Other | Attending: Cardiovascular Disease

## 2018-01-04 ENCOUNTER — Ambulatory Visit (INDEPENDENT_AMBULATORY_CARE_PROVIDER_SITE_OTHER): Payer: Medicare Other

## 2018-01-04 DIAGNOSIS — R002 Palpitations: Secondary | ICD-10-CM

## 2018-01-04 DIAGNOSIS — I071 Rheumatic tricuspid insufficiency: Secondary | ICD-10-CM | POA: Diagnosis not present

## 2018-01-04 DIAGNOSIS — R0609 Other forms of dyspnea: Secondary | ICD-10-CM

## 2018-06-13 DIAGNOSIS — H353131 Nonexudative age-related macular degeneration, bilateral, early dry stage: Secondary | ICD-10-CM | POA: Diagnosis not present

## 2018-09-05 DIAGNOSIS — J069 Acute upper respiratory infection, unspecified: Secondary | ICD-10-CM | POA: Diagnosis not present

## 2018-09-05 DIAGNOSIS — J029 Acute pharyngitis, unspecified: Secondary | ICD-10-CM | POA: Diagnosis not present

## 2018-09-05 DIAGNOSIS — R062 Wheezing: Secondary | ICD-10-CM | POA: Diagnosis not present

## 2018-09-05 DIAGNOSIS — R05 Cough: Secondary | ICD-10-CM | POA: Diagnosis not present

## 2018-09-05 DIAGNOSIS — Z6831 Body mass index (BMI) 31.0-31.9, adult: Secondary | ICD-10-CM | POA: Diagnosis not present

## 2018-09-08 DIAGNOSIS — R05 Cough: Secondary | ICD-10-CM | POA: Diagnosis not present

## 2018-09-08 DIAGNOSIS — J209 Acute bronchitis, unspecified: Secondary | ICD-10-CM | POA: Diagnosis not present

## 2018-09-08 DIAGNOSIS — H6980 Other specified disorders of Eustachian tube, unspecified ear: Secondary | ICD-10-CM | POA: Diagnosis not present

## 2018-09-08 DIAGNOSIS — Z6831 Body mass index (BMI) 31.0-31.9, adult: Secondary | ICD-10-CM | POA: Diagnosis not present

## 2018-09-28 DIAGNOSIS — H353131 Nonexudative age-related macular degeneration, bilateral, early dry stage: Secondary | ICD-10-CM | POA: Diagnosis not present

## 2018-10-12 DIAGNOSIS — M859 Disorder of bone density and structure, unspecified: Secondary | ICD-10-CM | POA: Diagnosis not present

## 2018-10-12 DIAGNOSIS — R82998 Other abnormal findings in urine: Secondary | ICD-10-CM | POA: Diagnosis not present

## 2018-10-12 DIAGNOSIS — Z79899 Other long term (current) drug therapy: Secondary | ICD-10-CM | POA: Diagnosis not present

## 2018-10-19 DIAGNOSIS — Z1389 Encounter for screening for other disorder: Secondary | ICD-10-CM | POA: Diagnosis not present

## 2018-10-19 DIAGNOSIS — Z6831 Body mass index (BMI) 31.0-31.9, adult: Secondary | ICD-10-CM | POA: Diagnosis not present

## 2018-10-19 DIAGNOSIS — Z0000 Encounter for general adult medical examination without abnormal findings: Secondary | ICD-10-CM | POA: Diagnosis not present

## 2018-10-19 DIAGNOSIS — F418 Other specified anxiety disorders: Secondary | ICD-10-CM | POA: Diagnosis not present

## 2018-10-19 DIAGNOSIS — M859 Disorder of bone density and structure, unspecified: Secondary | ICD-10-CM | POA: Diagnosis not present

## 2018-10-19 DIAGNOSIS — R42 Dizziness and giddiness: Secondary | ICD-10-CM | POA: Diagnosis not present

## 2018-10-19 DIAGNOSIS — Z23 Encounter for immunization: Secondary | ICD-10-CM | POA: Diagnosis not present

## 2018-10-19 DIAGNOSIS — E668 Other obesity: Secondary | ICD-10-CM | POA: Diagnosis not present

## 2018-11-11 DIAGNOSIS — J209 Acute bronchitis, unspecified: Secondary | ICD-10-CM | POA: Diagnosis not present

## 2018-11-11 DIAGNOSIS — Z6832 Body mass index (BMI) 32.0-32.9, adult: Secondary | ICD-10-CM | POA: Diagnosis not present

## 2018-11-11 DIAGNOSIS — R05 Cough: Secondary | ICD-10-CM | POA: Diagnosis not present

## 2018-11-17 DIAGNOSIS — M8589 Other specified disorders of bone density and structure, multiple sites: Secondary | ICD-10-CM | POA: Diagnosis not present

## 2018-11-17 DIAGNOSIS — M859 Disorder of bone density and structure, unspecified: Secondary | ICD-10-CM | POA: Diagnosis not present

## 2018-12-23 DIAGNOSIS — Z1212 Encounter for screening for malignant neoplasm of rectum: Secondary | ICD-10-CM | POA: Diagnosis not present

## 2018-12-23 DIAGNOSIS — M170 Bilateral primary osteoarthritis of knee: Secondary | ICD-10-CM | POA: Diagnosis not present

## 2018-12-30 DIAGNOSIS — M170 Bilateral primary osteoarthritis of knee: Secondary | ICD-10-CM | POA: Diagnosis not present

## 2019-01-06 DIAGNOSIS — M170 Bilateral primary osteoarthritis of knee: Secondary | ICD-10-CM | POA: Diagnosis not present

## 2019-03-20 DIAGNOSIS — B351 Tinea unguium: Secondary | ICD-10-CM | POA: Diagnosis not present

## 2019-03-20 DIAGNOSIS — Z1283 Encounter for screening for malignant neoplasm of skin: Secondary | ICD-10-CM | POA: Diagnosis not present

## 2019-03-20 DIAGNOSIS — D225 Melanocytic nevi of trunk: Secondary | ICD-10-CM | POA: Diagnosis not present

## 2019-03-28 DIAGNOSIS — B351 Tinea unguium: Secondary | ICD-10-CM | POA: Diagnosis not present

## 2019-03-28 DIAGNOSIS — J309 Allergic rhinitis, unspecified: Secondary | ICD-10-CM | POA: Diagnosis not present

## 2019-03-28 DIAGNOSIS — J40 Bronchitis, not specified as acute or chronic: Secondary | ICD-10-CM | POA: Diagnosis not present

## 2019-05-15 DIAGNOSIS — M545 Low back pain: Secondary | ICD-10-CM | POA: Diagnosis not present

## 2019-05-15 DIAGNOSIS — M25552 Pain in left hip: Secondary | ICD-10-CM | POA: Diagnosis not present

## 2019-05-31 DIAGNOSIS — Z79899 Other long term (current) drug therapy: Secondary | ICD-10-CM | POA: Diagnosis not present

## 2019-06-02 DIAGNOSIS — G43909 Migraine, unspecified, not intractable, without status migrainosus: Secondary | ICD-10-CM | POA: Diagnosis not present

## 2019-06-02 DIAGNOSIS — H5213 Myopia, bilateral: Secondary | ICD-10-CM | POA: Diagnosis not present

## 2019-06-02 DIAGNOSIS — H353131 Nonexudative age-related macular degeneration, bilateral, early dry stage: Secondary | ICD-10-CM | POA: Diagnosis not present

## 2019-06-12 DIAGNOSIS — M25552 Pain in left hip: Secondary | ICD-10-CM | POA: Diagnosis not present

## 2019-08-02 DIAGNOSIS — M170 Bilateral primary osteoarthritis of knee: Secondary | ICD-10-CM | POA: Diagnosis not present

## 2019-08-11 DIAGNOSIS — M170 Bilateral primary osteoarthritis of knee: Secondary | ICD-10-CM | POA: Diagnosis not present

## 2019-08-18 DIAGNOSIS — M170 Bilateral primary osteoarthritis of knee: Secondary | ICD-10-CM | POA: Diagnosis not present

## 2019-08-25 DIAGNOSIS — Z23 Encounter for immunization: Secondary | ICD-10-CM | POA: Diagnosis not present

## 2019-11-06 DIAGNOSIS — R0609 Other forms of dyspnea: Secondary | ICD-10-CM | POA: Diagnosis not present

## 2019-11-06 DIAGNOSIS — Z20828 Contact with and (suspected) exposure to other viral communicable diseases: Secondary | ICD-10-CM | POA: Diagnosis not present

## 2019-11-06 DIAGNOSIS — U071 COVID-19: Secondary | ICD-10-CM | POA: Diagnosis not present

## 2019-11-20 DIAGNOSIS — R0981 Nasal congestion: Secondary | ICD-10-CM | POA: Diagnosis not present

## 2019-11-20 DIAGNOSIS — R1013 Epigastric pain: Secondary | ICD-10-CM | POA: Diagnosis not present

## 2019-11-20 DIAGNOSIS — K219 Gastro-esophageal reflux disease without esophagitis: Secondary | ICD-10-CM | POA: Diagnosis not present

## 2019-11-20 DIAGNOSIS — Z8616 Personal history of COVID-19: Secondary | ICD-10-CM | POA: Diagnosis not present

## 2019-11-20 DIAGNOSIS — R0609 Other forms of dyspnea: Secondary | ICD-10-CM | POA: Diagnosis not present

## 2019-11-20 DIAGNOSIS — H9202 Otalgia, left ear: Secondary | ICD-10-CM | POA: Diagnosis not present

## 2019-11-22 ENCOUNTER — Other Ambulatory Visit: Payer: Self-pay | Admitting: Internal Medicine

## 2019-11-22 DIAGNOSIS — R748 Abnormal levels of other serum enzymes: Secondary | ICD-10-CM

## 2019-11-23 ENCOUNTER — Ambulatory Visit
Admission: RE | Admit: 2019-11-23 | Discharge: 2019-11-23 | Disposition: A | Discharge status: Home / Self Care | Payer: Medicare Other | Source: Ambulatory Visit | Attending: Internal Medicine | Admitting: Internal Medicine

## 2019-11-23 DIAGNOSIS — R748 Abnormal levels of other serum enzymes: Secondary | ICD-10-CM

## 2019-12-25 DIAGNOSIS — Z8616 Personal history of COVID-19: Secondary | ICD-10-CM | POA: Diagnosis not present

## 2019-12-25 DIAGNOSIS — R05 Cough: Secondary | ICD-10-CM | POA: Diagnosis not present

## 2019-12-25 DIAGNOSIS — J309 Allergic rhinitis, unspecified: Secondary | ICD-10-CM | POA: Diagnosis not present

## 2019-12-25 DIAGNOSIS — K219 Gastro-esophageal reflux disease without esophagitis: Secondary | ICD-10-CM | POA: Diagnosis not present

## 2019-12-25 DIAGNOSIS — J40 Bronchitis, not specified as acute or chronic: Secondary | ICD-10-CM | POA: Diagnosis not present

## 2019-12-27 DIAGNOSIS — K219 Gastro-esophageal reflux disease without esophagitis: Secondary | ICD-10-CM | POA: Diagnosis not present

## 2019-12-27 DIAGNOSIS — R1010 Upper abdominal pain, unspecified: Secondary | ICD-10-CM | POA: Diagnosis not present

## 2019-12-27 DIAGNOSIS — R748 Abnormal levels of other serum enzymes: Secondary | ICD-10-CM | POA: Diagnosis not present

## 2019-12-27 DIAGNOSIS — R140 Abdominal distension (gaseous): Secondary | ICD-10-CM | POA: Diagnosis not present

## 2019-12-27 DIAGNOSIS — K838 Other specified diseases of biliary tract: Secondary | ICD-10-CM | POA: Diagnosis not present

## 2019-12-28 ENCOUNTER — Other Ambulatory Visit: Payer: Self-pay | Admitting: Gastroenterology

## 2019-12-28 DIAGNOSIS — K838 Other specified diseases of biliary tract: Secondary | ICD-10-CM

## 2020-01-01 DIAGNOSIS — R748 Abnormal levels of other serum enzymes: Secondary | ICD-10-CM | POA: Diagnosis not present

## 2020-01-15 DIAGNOSIS — Z79899 Other long term (current) drug therapy: Secondary | ICD-10-CM | POA: Diagnosis not present

## 2020-01-22 DIAGNOSIS — R82998 Other abnormal findings in urine: Secondary | ICD-10-CM | POA: Diagnosis not present

## 2020-01-22 DIAGNOSIS — R109 Unspecified abdominal pain: Secondary | ICD-10-CM | POA: Diagnosis not present

## 2020-01-22 DIAGNOSIS — R05 Cough: Secondary | ICD-10-CM | POA: Diagnosis not present

## 2020-01-22 DIAGNOSIS — Z0000 Encounter for general adult medical examination without abnormal findings: Secondary | ICD-10-CM | POA: Diagnosis not present

## 2020-01-22 DIAGNOSIS — E669 Obesity, unspecified: Secondary | ICD-10-CM | POA: Diagnosis not present

## 2020-01-22 DIAGNOSIS — K219 Gastro-esophageal reflux disease without esophagitis: Secondary | ICD-10-CM | POA: Diagnosis not present

## 2020-01-22 DIAGNOSIS — F418 Other specified anxiety disorders: Secondary | ICD-10-CM | POA: Diagnosis not present

## 2020-01-22 DIAGNOSIS — Z1331 Encounter for screening for depression: Secondary | ICD-10-CM | POA: Diagnosis not present

## 2020-01-22 DIAGNOSIS — Z8616 Personal history of COVID-19: Secondary | ICD-10-CM | POA: Diagnosis not present

## 2020-01-24 ENCOUNTER — Ambulatory Visit
Admission: RE | Admit: 2020-01-24 | Discharge: 2020-01-24 | Disposition: A | Discharge status: Home / Self Care | Payer: Medicare Other | Source: Ambulatory Visit | Attending: Gastroenterology | Admitting: Gastroenterology

## 2020-01-24 DIAGNOSIS — K838 Other specified diseases of biliary tract: Secondary | ICD-10-CM

## 2020-01-24 MED ORDER — GADOBENATE DIMEGLUMINE 529 MG/ML IV SOLN
15.0000 mL | Freq: Once | INTRAVENOUS | Status: AC | PRN
  Administered 2020-01-24: 15 mL via INTRAVENOUS

## 2020-03-11 DIAGNOSIS — Z23 Encounter for immunization: Secondary | ICD-10-CM | POA: Diagnosis not present

## 2020-05-29 DIAGNOSIS — R05 Cough: Secondary | ICD-10-CM | POA: Diagnosis not present

## 2020-05-29 DIAGNOSIS — E669 Obesity, unspecified: Secondary | ICD-10-CM | POA: Diagnosis not present

## 2020-06-26 DIAGNOSIS — H353131 Nonexudative age-related macular degeneration, bilateral, early dry stage: Secondary | ICD-10-CM | POA: Diagnosis not present

## 2020-06-26 DIAGNOSIS — H5213 Myopia, bilateral: Secondary | ICD-10-CM | POA: Diagnosis not present

## 2020-07-22 DIAGNOSIS — E669 Obesity, unspecified: Secondary | ICD-10-CM | POA: Diagnosis not present

## 2020-07-22 DIAGNOSIS — R05 Cough: Secondary | ICD-10-CM | POA: Diagnosis not present

## 2020-07-22 DIAGNOSIS — R109 Unspecified abdominal pain: Secondary | ICD-10-CM | POA: Diagnosis not present

## 2020-08-26 DIAGNOSIS — M170 Bilateral primary osteoarthritis of knee: Secondary | ICD-10-CM | POA: Diagnosis not present

## 2020-09-02 DIAGNOSIS — M170 Bilateral primary osteoarthritis of knee: Secondary | ICD-10-CM | POA: Diagnosis not present

## 2020-09-11 DIAGNOSIS — M170 Bilateral primary osteoarthritis of knee: Secondary | ICD-10-CM | POA: Diagnosis not present

## 2020-10-04 DIAGNOSIS — L02821 Furuncle of head [any part, except face]: Secondary | ICD-10-CM | POA: Diagnosis not present

## 2020-10-04 DIAGNOSIS — D225 Melanocytic nevi of trunk: Secondary | ICD-10-CM | POA: Diagnosis not present

## 2020-10-04 DIAGNOSIS — B9689 Other specified bacterial agents as the cause of diseases classified elsewhere: Secondary | ICD-10-CM | POA: Diagnosis not present

## 2020-10-04 DIAGNOSIS — Z1283 Encounter for screening for malignant neoplasm of skin: Secondary | ICD-10-CM | POA: Diagnosis not present

## 2020-10-04 DIAGNOSIS — L820 Inflamed seborrheic keratosis: Secondary | ICD-10-CM | POA: Diagnosis not present

## 2020-10-07 DIAGNOSIS — R519 Headache, unspecified: Secondary | ICD-10-CM | POA: Diagnosis not present

## 2020-10-07 DIAGNOSIS — Z1152 Encounter for screening for COVID-19: Secondary | ICD-10-CM | POA: Diagnosis not present

## 2020-10-07 DIAGNOSIS — Z20818 Contact with and (suspected) exposure to other bacterial communicable diseases: Secondary | ICD-10-CM | POA: Diagnosis not present

## 2020-10-07 DIAGNOSIS — R059 Cough, unspecified: Secondary | ICD-10-CM | POA: Diagnosis not present

## 2020-10-07 DIAGNOSIS — B349 Viral infection, unspecified: Secondary | ICD-10-CM | POA: Diagnosis not present

## 2021-01-14 ENCOUNTER — Other Ambulatory Visit: Payer: Self-pay | Admitting: Internal Medicine

## 2021-01-14 DIAGNOSIS — Z1231 Encounter for screening mammogram for malignant neoplasm of breast: Secondary | ICD-10-CM

## 2021-01-21 DIAGNOSIS — Z79899 Other long term (current) drug therapy: Secondary | ICD-10-CM | POA: Diagnosis not present

## 2021-01-21 DIAGNOSIS — M8589 Other specified disorders of bone density and structure, multiple sites: Secondary | ICD-10-CM | POA: Diagnosis not present

## 2021-01-29 DIAGNOSIS — L309 Dermatitis, unspecified: Secondary | ICD-10-CM | POA: Diagnosis not present

## 2021-01-29 DIAGNOSIS — Z1331 Encounter for screening for depression: Secondary | ICD-10-CM | POA: Diagnosis not present

## 2021-01-29 DIAGNOSIS — E669 Obesity, unspecified: Secondary | ICD-10-CM | POA: Diagnosis not present

## 2021-01-29 DIAGNOSIS — R82998 Other abnormal findings in urine: Secondary | ICD-10-CM | POA: Diagnosis not present

## 2021-01-29 DIAGNOSIS — Z1339 Encounter for screening examination for other mental health and behavioral disorders: Secondary | ICD-10-CM | POA: Diagnosis not present

## 2021-01-29 DIAGNOSIS — Z0000 Encounter for general adult medical examination without abnormal findings: Secondary | ICD-10-CM | POA: Diagnosis not present

## 2021-01-30 DIAGNOSIS — Z1212 Encounter for screening for malignant neoplasm of rectum: Secondary | ICD-10-CM | POA: Diagnosis not present

## 2021-03-07 ENCOUNTER — Ambulatory Visit: Payer: Medicare Other

## 2021-04-04 DIAGNOSIS — M170 Bilateral primary osteoarthritis of knee: Secondary | ICD-10-CM | POA: Diagnosis not present

## 2021-04-11 DIAGNOSIS — M170 Bilateral primary osteoarthritis of knee: Secondary | ICD-10-CM | POA: Diagnosis not present

## 2021-04-18 DIAGNOSIS — M170 Bilateral primary osteoarthritis of knee: Secondary | ICD-10-CM | POA: Diagnosis not present

## 2021-04-25 ENCOUNTER — Other Ambulatory Visit: Payer: Self-pay

## 2021-04-25 ENCOUNTER — Ambulatory Visit
Admission: RE | Admit: 2021-04-25 | Discharge: 2021-04-25 | Disposition: A | Discharge status: Home / Self Care | Payer: Medicare Other | Source: Ambulatory Visit | Attending: Internal Medicine | Admitting: Internal Medicine

## 2021-04-25 DIAGNOSIS — Z1231 Encounter for screening mammogram for malignant neoplasm of breast: Secondary | ICD-10-CM

## 2021-07-09 DIAGNOSIS — H2513 Age-related nuclear cataract, bilateral: Secondary | ICD-10-CM | POA: Diagnosis not present

## 2021-07-09 DIAGNOSIS — H353131 Nonexudative age-related macular degeneration, bilateral, early dry stage: Secondary | ICD-10-CM | POA: Diagnosis not present

## 2021-07-09 DIAGNOSIS — H5213 Myopia, bilateral: Secondary | ICD-10-CM | POA: Diagnosis not present

## 2021-07-29 ENCOUNTER — Telehealth: Payer: Self-pay | Admitting: Interventional Cardiology

## 2021-07-29 DIAGNOSIS — J45909 Unspecified asthma, uncomplicated: Secondary | ICD-10-CM | POA: Diagnosis not present

## 2021-07-29 DIAGNOSIS — R002 Palpitations: Secondary | ICD-10-CM | POA: Diagnosis not present

## 2021-07-29 DIAGNOSIS — R079 Chest pain, unspecified: Secondary | ICD-10-CM | POA: Diagnosis not present

## 2021-07-29 DIAGNOSIS — R059 Cough, unspecified: Secondary | ICD-10-CM | POA: Diagnosis not present

## 2021-07-29 NOTE — Telephone Encounter (Signed)
Pt states that she is having the same fluttering feeling that she had when she seen Dr. Tamala Julian in 2019 but it seems to be more often and more intense.  Been having chest discomfort 1-3 times a day.  Denies true chest pain.  States it's more like a "dull toothache".  Denies radiation of the discomfort.  Was recently at the beach and had done some strenuous walking and developed chest discomfort that lasted about 30 mins.  Denies any current discomfort or fluttering.  Didn't have readings with her but states vitals are always fine.  Advised pt that since she hasn't been seen in 3 years, she is considered a new patient and current new patient appts are out several weeks.  I did offer to check sister offices ([facility] and [facility]) but pt declined stating she would prefer to see Dr. Tamala Julian. Advised pt to see PCP first and then if they feel she needs to see Cardiology, then they can send a referral.  Discussed ER protocol.  Pt verbalized understanding and was in agreement with plan.

## 2021-07-29 NOTE — Telephone Encounter (Signed)
Pt c/o of Chest Pain: 1. Are you having CP right now? Discomfort right this minute no 2. Are you experiencing any other symptoms (ex. SOB, nausea, vomiting, sweating)? No  3. How long have you been experiencing CP? About a month 4. Is your CP continuous or coming and going? Coming and going 5. Have you taken Nitroglycerin? No Just Asprin. Patient last saw Dr. Tamala Julian 02/19

## 2021-08-19 ENCOUNTER — Ambulatory Visit (INDEPENDENT_AMBULATORY_CARE_PROVIDER_SITE_OTHER): Payer: Medicare Other | Admitting: Cardiology

## 2021-08-19 ENCOUNTER — Other Ambulatory Visit: Payer: Self-pay

## 2021-08-19 ENCOUNTER — Ambulatory Visit (INDEPENDENT_AMBULATORY_CARE_PROVIDER_SITE_OTHER): Payer: Medicare Other

## 2021-08-19 ENCOUNTER — Encounter: Payer: Self-pay | Admitting: Cardiology

## 2021-08-19 VITALS — BP 122/82 | HR 58 | Ht 63.0 in | Wt 177.0 lb

## 2021-08-19 DIAGNOSIS — R9431 Abnormal electrocardiogram [ECG] [EKG]: Secondary | ICD-10-CM

## 2021-08-19 DIAGNOSIS — R002 Palpitations: Secondary | ICD-10-CM

## 2021-08-19 DIAGNOSIS — R079 Chest pain, unspecified: Secondary | ICD-10-CM | POA: Diagnosis not present

## 2021-08-19 LAB — BASIC METABOLIC PANEL
BUN/Creatinine Ratio: 18 (ref 12–28)
BUN: 13 mg/dL (ref 8–27)
CO2: 23 mmol/L (ref 20–29)
Calcium: 9.4 mg/dL (ref 8.7–10.3)
Chloride: 101 mmol/L (ref 96–106)
Creatinine, Ser: 0.73 mg/dL (ref 0.57–1.00)
Glucose: 85 mg/dL (ref 70–99)
Potassium: 4.5 mmol/L (ref 3.5–5.2)
Sodium: 136 mmol/L (ref 134–144)
eGFR: 86 mL/min/{1.73_m2} (ref >59)

## 2021-08-19 NOTE — Progress Notes (Signed)
Cardiology CONSULT Note    Date:  08/19/2021   ID:  Cathy Tucker, Cathy Tucker [birth year], MRN [id number]  PCP:  Velna Hatchet, MD  Cardiologist:  Fransico Him, MD   Chief Complaint  Patient presents with   New Patient (Initial Visit)    Chest pain or palpitations     History of Present Illness:  Cathy Tucker is a 75 y.o. female who is being seen today for the evaluation of Chest pain and palpitations at the request of Velna Hatchet, MD.  This is a 75yo female female with a hx of migraine HAs and anxiety who is referred for evaluation of chest pain and palpitations.  She tells me that the palpitations have been going on for a few years but recently have become more recent.  She tells me that the chest pain feels like a tooth ache in the mid sternal region that is very sporadic and comes and goes.  It can occur with activity but also when at rest.  She was recently at the beach and did a lot of walking and had an extended time with CP when walking in deep sand.  There is no associated Nausea or diaphoresis but does get SOB with it.  She has never smoked and has no fm hx of CAD.  She occasionally has DOE when walking up hills.  She denies any PND, orthopnea, LE edema or syncope.  Occasionally she will feel off balance when walking.     Past Medical History:  Diagnosis Date   Allergic rhinitis    Athlete's foot    Chest pain    COVID    Fracture of humerus, proximal, right, closed 11/22/2015   Knee pain    Migraines    Migraines    Osteopenia    Palpitations    Situational anxiety     Past Surgical History:  Procedure Laterality Date   ABDOMINAL HYSTERECTOMY     CARPAL TUNNEL WITH CUBITAL TUNNEL     HAND SURGERY     ORIF HUMERUS FRACTURE Right 11/22/2015   Procedure: OPEN REDUCTION INTERNAL FIXATION (ORIF) RIGHT PROXIMAL HUMERUS FRACTURE;  Surgeon: Marchia Bond, MD;  Location: [facility];  Service: Orthopedics;  Laterality: Right;   ulnar nerve  decompression      Current Medications: No outpatient medications have been marked as taking for the 08/19/21 encounter (Office Visit) with Sueanne Margarita, MD.    Allergies:   Tetracyclines & related and Penicillins   Social History   Socioeconomic History   Marital status: Married    Spouse name: Not on file   Number of children: 2   Years of education: Not on file   Highest education level: Not on file  Occupational History   Occupation: adult daycare  Tobacco Use   Smoking status: Never   Smokeless tobacco: Never  Vaping Use   Vaping Use: Never used  Substance and Sexual Activity   Alcohol use: No   Drug use: No   Sexual activity: Not on file  Other Topics Concern   Not on file  Social History Narrative   Not on file   Social Determinants of Health   Financial Resource Strain: Not on file  Food Insecurity: Not on file  Transportation Needs: Not on file  Physical Activity: Not on file  Stress: Not on file  Social Connections: Not on file     Family History:  The patient's family history includes Arthritis in her  paternal grandmother; Asthma in her sister; Brain cancer in her father; COPD in her sister; Depression in her sister; Diabetes Mellitus II in her paternal grandmother; Lung cancer in her father.   ROS:   Please see the history of present illness.    ROS All other systems reviewed and are negative.  PAD Screen 12/23/2017  Previous PAD dx? No  Previous surgical procedure? No  Pain with walking? No  Feet/toe relief with dangling? No  Painful, non-healing ulcers? No  Extremities discolored? No       PHYSICAL EXAM:   VS:  There were no vitals taken for this visit.   GEN: Well nourished, well developed, in no acute distress  HEENT: normal  Neck: no JVD, carotid bruits, or masses Cardiac: RRR; no murmurs, rubs, or gallops,no edema.  Intact distal pulses bilaterally.  Respiratory:  clear to auscultation bilaterally, normal work of breathing GI:  soft, nontender, nondistended, + BS MS: no deformity or atrophy  Skin: warm and dry, no rash Neuro:  Alert and Oriented x 3, Strength and sensation are intact Psych: euthymic mood, full affect  Wt Readings from Last 3 Encounters:  12/23/17 172 lb (78 kg)  11/22/15 174 lb (78.9 kg)      Studies/Labs Reviewed:   EKG:  EKG is ordered today.  The ekg ordered today demonstrates sinus bradycardia with PACs  Recent Labs: No results found for requested labs within last 8760 hours.   Lipid Panel No results found for: CHOL, TRIG, HDL, CHOLHDL, VLDL, LDLCALC, LDLDIRECT   Additional studies/ records that were reviewed today include:  OV notes from PCP    ASSESSMENT:    1. Chest pain of uncertain etiology   2. Palpitations      PLAN:  In order of problems listed above:  Chest pain -Her symptoms are typical and atypical but concerning for possible coronary ischemia -she has not CRFs and no fm hx of CAD -EKG is nonischemic -I will get a coronary CTA to define coronary anatomy  2.  Palpitations -these have occurred for several years but recently have become more frequent -I will get a 2 week ziopatch  Time Spent: 25 minutes total time of encounter, including 15 minutes spent in face-to-face patient care on the date of this encounter. This time includes coordination of care and counseling regarding above mentioned problem list. Remainder of non-face-to-face time involved reviewing chart documents/testing relevant to the patient encounter and documentation in the medical record. I have independently reviewed documentation from referring provider  Medication Adjustments/Labs and Tests Ordered: Current medicines are reviewed at length with the patient today.  Concerns regarding medicines are outlined above.  Medication changes, Labs and Tests ordered today are listed in the Patient Instructions below.  There are no Patient Instructions on file for this visit.   Signed, Fransico Him, MD  08/19/2021 8:14 AM    [facility] [place], [place], [place]  [address] Phone: [phone]; Fax: [phone]

## 2021-08-19 NOTE — Addendum Note (Signed)
Addended by: Antonieta Iba on: 08/19/2021 08:33 AM   Modules accepted: Orders

## 2021-08-19 NOTE — Progress Notes (Unsigned)
Enrolled patient for a 14 day Zio XT  monitor to be mailed to patients home  °

## 2021-08-19 NOTE — Addendum Note (Signed)
Addended by: Sueanne Margarita on: 08/19/2021 08:28 AM   Modules accepted: Level of Service

## 2021-08-19 NOTE — Patient Instructions (Addendum)
Medication Instructions:  Your physician recommends that you continue on your current medications as directed. Please refer to the Current Medication list given to you today.  *If you need a refill on your cardiac medications before your next appointment, please call your pharmacy*   Lab Work: TODAY: BMET If you have labs (blood work) drawn today and your tests are completely normal, you will receive your results only by: [place] (if you have MyChart) OR A paper copy in the mail If you have any lab test that is abnormal or we need to change your treatment, we will call you to review the results.   Testing/Procedures: Your physician has recommended that you wear an event monitor. Event monitors are medical devices that record the heart's electrical activity. Doctors most often [place] these monitors to diagnose arrhythmias. Arrhythmias are problems with the speed or rhythm of the heartbeat. The monitor is a small, portable device. You can wear one while you do your normal daily activities. This is usually used to diagnose what is causing palpitations/syncope (passing out).  Your physician has recommended that you have a coronary CTA scan. Please see below for further instructions.   Follow-Up: At [facility], you and your health needs are our priority.  As part of our continuing mission to provide you with exceptional heart care, we have created designated Provider Care Teams.  These Care Teams include your primary Cardiologist (physician) and Advanced Practice Providers (APPs -  Physician Assistants and Nurse Practitioners) who all work together to provide you with the care you need, when you need it.  Follow up with Dr. Radford Pax as needed based on results of testing.    Other Instructions    Your cardiac CT will be scheduled at one of the below locations:   [facility] [address] [place], [place] [address] [phone]  Please arrive at the [facility] main  entrance (entrance A) of [facility] 30 minutes prior to test start time. You can use the FREE valet parking offered at the main entrance (encouraged to control the heart rate for the test) Proceed to the [facility] Radiology Department (first floor) to check-in and test prep.  Please follow these instructions carefully (unless otherwise directed):   On the Night Before the Test: Be sure to Drink plenty of water. Do not consume any caffeinated/decaffeinated beverages or chocolate 12 hours prior to your test. Do not take any antihistamines 12 hours prior to your test.  On the Day of the Test: Drink plenty of water until 1 hour prior to the test. Do not eat any food 4 hours prior to the test. You may take your regular medications prior to the test.  FEMALES- please wear underwire-free bra if available, avoid dresses & tight clothing      After the Test: Drink plenty of water. After receiving IV contrast, you may experience a mild flushed feeling. This is normal. On occasion, you may experience a mild rash up to 24 hours after the test. This is not dangerous. If this occurs, you can take Benadryl 25 mg and increase your fluid intake. If you experience trouble breathing, this can be serious. If it is severe call 911 IMMEDIATELY. If it is mild, please call our office.   Please allow 2-4 weeks for scheduling of routine cardiac CTs. Some insurance companies require a pre-authorization which may delay scheduling of this test.   For non-scheduling related questions, please contact the cardiac imaging nurse navigator should  you have any questions/concerns: Marchia Bond, Cardiac Imaging Nurse Navigator Gordy Clement, Cardiac Imaging Nurse Navigator [facility] Direct Office Dial: [phone]   For scheduling needs, including cancellations and rescheduling, please call [place], [phone].   ZIO XT- Long Term Monitor Instructions  Your physician has  requested you wear a ZIO patch monitor for 14 days.  This is a single patch monitor. [employer] supplies one patch monitor per enrollment. Additional stickers are not available. Please do not apply patch if you will be having a Nuclear Stress Test,  Echocardiogram, Cardiac CT, MRI, or Chest Xray during the period you would be wearing the  monitor. The patch cannot be worn during these tests. You cannot remove and re-apply the  ZIO XT patch monitor.  Your ZIO patch monitor will be mailed 3 day USPS to your address on file. It may take 3-5 days  to receive your monitor after you have been enrolled.  Once you have received your monitor, please review the enclosed instructions. Your monitor  has already been registered assigning a specific monitor serial # to you.  Billing and Patient Assistance Program Information  We have supplied [employer] with any of your insurance information on file for billing purposes. [employer] offers a sliding scale Patient Assistance Program for patients that do not have  insurance, or whose insurance does not completely cover the cost of the ZIO monitor.  You must apply for the Patient Assistance Program to qualify for this discounted rate.  To apply, please call [employer] at [phone], select option 4, select option 2, ask to apply for  Patient Assistance Program. Theodore Demark will ask your household income, and how many people  are in your household. They will quote your out-of-pocket cost based on that information.  [employer] will also be able to set up a 33-month, interest-free payment plan if needed.  Applying the monitor   Shave hair from upper left chest.  Hold abrader disc by orange tab. Rub abrader in 40 strokes over the upper left chest as  indicated in your monitor instructions.  Clean area with 4 enclosed alcohol pads. Let dry.  Apply patch as indicated in monitor instructions. Patch will be placed under collarbone on left  side of chest with arrow pointing  upward.  Rub patch adhesive wings for 2 minutes. Remove white label marked "1". Remove the white  label marked "2". Rub patch adhesive wings for 2 additional minutes.  While looking in a mirror, press and release button in center of patch. A small green light will  flash 3-4 times. This will be your only indicator that the monitor has been turned on.  Do not shower for the first 24 hours. You may shower after the first 24 hours.  Press the button if you feel a symptom. You will hear a small click. Record Date, Time and  Symptom in the Patient Logbook.  When you are ready to remove the patch, follow instructions on the last 2 pages of Patient  Logbook. Stick patch monitor onto the last page of Patient Logbook.  Place Patient Logbook in the blue and white box. Use locking tab on box and tape box closed  securely. The blue and white box has prepaid postage on it. Please place it in the mailbox as  soon as possible. Your physician should have your test results approximately 7 days after the  monitor has been mailed back to [facility].  Call [place] at [phone] if you have  questions regarding  your ZIO XT patch monitor. Call them immediately if you see an orange light blinking on your  monitor.  If your monitor falls off in less than 4 days, contact our Monitor department at [phone].  If your monitor becomes loose or falls off after 4 days call [employer] at [phone] for  suggestions on securing your monitor

## 2021-08-25 ENCOUNTER — Telehealth (HOSPITAL_COMMUNITY): Payer: Self-pay | Admitting: *Deleted

## 2021-08-25 NOTE — Telephone Encounter (Signed)
Patient returning call regarding upcoming cardiac imaging study; pt verbalizes understanding of appt date/time, parking situation and where to check in, pre-test NPO status and verified current allergies; name and call back number provided for further questions should they arise  Gordy Clement RN Navigator Cardiac Imaging [facility] [phone] office [phone] cell

## 2021-08-25 NOTE — Telephone Encounter (Signed)
Attempted to call patient regarding upcoming cardiac CT appointment. °Left message on voicemail with name and callback number ° °Kamori Barbier RN Navigator Cardiac Imaging °[facility] °[phone] Office °[phone] Cell ° °

## 2021-08-26 ENCOUNTER — Encounter (HOSPITAL_COMMUNITY): Payer: Self-pay

## 2021-08-26 ENCOUNTER — Other Ambulatory Visit: Payer: Self-pay

## 2021-08-26 ENCOUNTER — Ambulatory Visit (HOSPITAL_COMMUNITY)
Admission: RE | Admit: 2021-08-26 | Discharge: 2021-08-26 | Disposition: A | Discharge status: Home / Self Care | Payer: Medicare Other | Source: Ambulatory Visit | Attending: Cardiology | Admitting: Cardiology

## 2021-08-26 ENCOUNTER — Telehealth: Payer: Self-pay

## 2021-08-26 DIAGNOSIS — R079 Chest pain, unspecified: Secondary | ICD-10-CM

## 2021-08-26 DIAGNOSIS — R9431 Abnormal electrocardiogram [ECG] [EKG]: Secondary | ICD-10-CM | POA: Diagnosis not present

## 2021-08-26 DIAGNOSIS — R002 Palpitations: Secondary | ICD-10-CM

## 2021-08-26 MED ORDER — NITROGLYCERIN 0.4 MG SL SUBL
0.8000 mg | SUBLINGUAL_TABLET | Freq: Once | SUBLINGUAL | Status: AC
  Administered 2021-08-26: 0.8 mg via SUBLINGUAL

## 2021-08-26 MED ORDER — METOPROLOL TARTRATE 5 MG/5ML IV SOLN
INTRAVENOUS | Status: AC
  Filled 2021-08-26: qty 10

## 2021-08-26 MED ORDER — METOPROLOL TARTRATE 5 MG/5ML IV SOLN
5.0000 mg | INTRAVENOUS | Status: DC | PRN
Start: 2021-08-26 — End: 2021-08-27
  Administered 2021-08-26: 5 mg via INTRAVENOUS

## 2021-08-26 MED ORDER — IOHEXOL 350 MG/ML SOLN
95.0000 mL | Freq: Once | INTRAVENOUS | Status: AC | PRN
  Administered 2021-08-26: 95 mL via INTRAVENOUS

## 2021-08-26 MED ORDER — NITROGLYCERIN 0.4 MG SL SUBL
SUBLINGUAL_TABLET | SUBLINGUAL | Status: AC
  Filled 2021-08-26: qty 2

## 2021-08-26 NOTE — Progress Notes (Signed)
CT scan completed. Tolerated well. D/C home ambulatory with husband. Awake and alert. In no distress. 

## 2021-08-26 NOTE — Telephone Encounter (Signed)
The patient has been notified of the result and verbalized understanding.  All questions (if any) were answered. Antonieta Iba, RN 08/26/2021 4:47 PM  Patient will come in for lab work tomorrow.

## 2021-08-26 NOTE — Telephone Encounter (Signed)
-----   Message from Sueanne Margarita, MD sent at 08/26/2021  4:41 PM EDT ----- Coronary CTA showed coronary calcium score of 0 with minimal soft plaque in the OM1.  Please have patient come in for FLP and ALT.

## 2021-08-27 ENCOUNTER — Telehealth: Payer: Self-pay

## 2021-08-27 ENCOUNTER — Other Ambulatory Visit: Payer: Medicare Other | Admitting: *Deleted

## 2021-08-27 DIAGNOSIS — R002 Palpitations: Secondary | ICD-10-CM | POA: Diagnosis not present

## 2021-08-27 DIAGNOSIS — R079 Chest pain, unspecified: Secondary | ICD-10-CM

## 2021-08-27 LAB — LIPID PANEL
Chol/HDL Ratio: 2.8 ratio (ref 0.0–4.4)
Cholesterol, Total: 182 mg/dL (ref 100–199)
HDL: 64 mg/dL (ref >39)
LDL Chol Calc (NIH): 100 mg/dL — ABNORMAL HIGH (ref 0–99)
Triglycerides: 99 mg/dL (ref 0–149)
VLDL Cholesterol Cal: 18 mg/dL (ref 5–40)

## 2021-08-27 LAB — ALT: ALT: 23 IU/L (ref 0–32)

## 2021-08-27 NOTE — Telephone Encounter (Signed)
-----   Message from Sueanne Margarita, MD sent at 08/27/2021  5:28 PM EDT ----- FLP and ALT in 3 months ----- Message ----- From: Antonieta Iba, RN Sent: 08/27/2021   5:09 PM EDT To: Sueanne Margarita, MD  The patient has been notified of the result and verbalized understanding.  All questions (if any) were answered. Antonieta Iba, RN 08/27/2021 5:08 PM   Patient does not want to start on cholesterol medication. She states that she would prefer to work on her diet and exercise to lower her cholesterol prior to trying medication.

## 2021-09-17 ENCOUNTER — Telehealth: Payer: Self-pay

## 2021-09-17 DIAGNOSIS — R079 Chest pain, unspecified: Secondary | ICD-10-CM

## 2021-09-17 DIAGNOSIS — R002 Palpitations: Secondary | ICD-10-CM | POA: Diagnosis not present

## 2021-09-17 NOTE — Telephone Encounter (Signed)
-----   Message from Sueanne Margarita, MD sent at 09/17/2021  3:40 PM EST ----- Heart monitor showed extra heartbeats from the top of the heart and from the bottom of the heart.  Nonsustained atrial tachycardia lasted as long as 12 seconds.  .  Please have her come in for a magnesium level and a TSH.  2D echocardiogram to assess LV function.  Set up for 4-week follow-up which can be virtual.  Please find out how symptomatic she is with these palpitations.  Her heart rate by EKG was in the upper 50s at last office visit.  Even though her average heart rate on the heart monitor was 72 bpm I am a little leery about starting her on a beta-blocker or CCB to suppress these

## 2021-09-17 NOTE — Telephone Encounter (Signed)
The patient has been notified of the result and verbalized understanding.  All questions (if any) were answered. Cathy Iba, RN 09/17/2021 4:58 PM  Patient has been scheduled for lab work and echocardiogram.  She reports that her palpitations come and go. She does feel fatigued often. She reports that sometimes when she gets the palpitations it stops her in her tracks for a few seconds but then she is fine. They typically occur when she is not doing anything.

## 2021-09-18 MED ORDER — METOPROLOL SUCCINATE ER 25 MG PO TB24: 12.5000 mg | ORAL_TABLET | Freq: Every day | ORAL | 3 refills | Status: DC

## 2021-09-18 NOTE — Addendum Note (Signed)
Addended by: Antonieta Iba on: 09/18/2021 10:25 AM   Modules accepted: Orders

## 2021-09-18 NOTE — Telephone Encounter (Signed)
Spoke with the patient and advised her to start on Toprol XL 12.5 mg daily. Patient is already scheduled for a follow up visit with Dr. Radford Pax. Rx has been sent in.

## 2021-09-22 DIAGNOSIS — R051 Acute cough: Secondary | ICD-10-CM | POA: Diagnosis not present

## 2021-09-22 DIAGNOSIS — R519 Headache, unspecified: Secondary | ICD-10-CM | POA: Diagnosis not present

## 2021-09-22 DIAGNOSIS — Z1152 Encounter for screening for COVID-19: Secondary | ICD-10-CM | POA: Diagnosis not present

## 2021-09-22 DIAGNOSIS — R0981 Nasal congestion: Secondary | ICD-10-CM | POA: Diagnosis not present

## 2021-09-22 DIAGNOSIS — J3089 Other allergic rhinitis: Secondary | ICD-10-CM | POA: Diagnosis not present

## 2021-09-22 DIAGNOSIS — J028 Acute pharyngitis due to other specified organisms: Secondary | ICD-10-CM | POA: Diagnosis not present

## 2021-10-10 ENCOUNTER — Other Ambulatory Visit: Payer: Self-pay

## 2021-10-10 ENCOUNTER — Other Ambulatory Visit: Payer: Medicare Other | Admitting: *Deleted

## 2021-10-10 ENCOUNTER — Ambulatory Visit (HOSPITAL_COMMUNITY): Payer: Medicare Other | Attending: Interventional Cardiology

## 2021-10-10 DIAGNOSIS — R079 Chest pain, unspecified: Secondary | ICD-10-CM | POA: Insufficient documentation

## 2021-10-10 DIAGNOSIS — R002 Palpitations: Secondary | ICD-10-CM | POA: Insufficient documentation

## 2021-10-10 LAB — ECHOCARDIOGRAM COMPLETE
Area-P 1/2: 3.6 cm2
S' Lateral: 2.5 cm

## 2021-10-10 LAB — MAGNESIUM: Magnesium: 2 mg/dL (ref 1.6–2.3)

## 2021-10-10 LAB — TSH: TSH: 1.57 u[IU]/mL (ref 0.450–4.500)

## 2021-10-17 ENCOUNTER — Ambulatory Visit: Payer: Medicare Other | Admitting: Cardiology

## 2021-11-17 ENCOUNTER — Other Ambulatory Visit: Payer: Self-pay

## 2021-11-17 ENCOUNTER — Encounter: Payer: Self-pay | Admitting: Cardiology

## 2021-11-17 ENCOUNTER — Telehealth (INDEPENDENT_AMBULATORY_CARE_PROVIDER_SITE_OTHER): Payer: Medicare Other | Admitting: Cardiology

## 2021-11-17 VITALS — BP 138/66 | HR 70 | Ht 63.0 in | Wt 175.0 lb

## 2021-11-17 DIAGNOSIS — I493 Ventricular premature depolarization: Secondary | ICD-10-CM | POA: Diagnosis not present

## 2021-11-17 DIAGNOSIS — I2510 Atherosclerotic heart disease of native coronary artery without angina pectoris: Secondary | ICD-10-CM | POA: Diagnosis not present

## 2021-11-17 DIAGNOSIS — E785 Hyperlipidemia, unspecified: Secondary | ICD-10-CM | POA: Insufficient documentation

## 2021-11-17 DIAGNOSIS — R002 Palpitations: Secondary | ICD-10-CM

## 2021-11-17 DIAGNOSIS — I471 Supraventricular tachycardia: Secondary | ICD-10-CM | POA: Diagnosis not present

## 2021-11-17 DIAGNOSIS — R079 Chest pain, unspecified: Secondary | ICD-10-CM

## 2021-11-17 DIAGNOSIS — E7800 Pure hypercholesterolemia, unspecified: Secondary | ICD-10-CM

## 2021-11-17 NOTE — Addendum Note (Signed)
Addended by: Ma Hillock on: 11/17/2021 09:56 AM   Modules accepted: Orders

## 2021-11-17 NOTE — Patient Instructions (Signed)
Medication Instructions:  NONE *If you need a refill on your cardiac medications before your next appointment, please call your pharmacy*   Lab Work: Lipid panel in 4 months (scheduled for 5/16)  If you have labs (blood work) drawn today and your tests are completely normal, you will receive your results only by: [place] (if you have MyChart) OR A paper copy in the mail If you have any lab test that is abnormal or we need to change your treatment, we will call you to review the results.   Testing/Procedures: NONE  Follow-Up: At [facility], you and your health needs are our priority.  As part of our continuing mission to provide you with exceptional heart care, we have created designated Provider Care Teams.  These Care Teams include your primary Cardiologist (physician) and Advanced Practice Providers (APPs -  Physician Assistants and Nurse Practitioners) who all work together to provide you with the care you need, when you need it.  Your next appointment:   1 year(s)  The format for your next appointment:   In Person  Provider:   Fransico Him, MD

## 2021-11-17 NOTE — Progress Notes (Signed)
Virtual Visit via Video Note   This visit type was conducted due to national recommendations for restrictions regarding the COVID-19 Pandemic (e.g. social distancing) in an effort to limit this patient's exposure and mitigate transmission in our community.  Due to her co-morbid illnesses, this patient is at least at moderate risk for complications without adequate follow up.  This format is felt to be most appropriate for this patient at this time.  All issues noted in this document were discussed and addressed.  A limited physical exam was performed with this format.  Please refer to the patient's chart for her consent to telehealth for [facility].  Date:  11/17/2021   ID:  Cathy, Tucker [id number], MRN [id number] The patient was identified using 2 identifiers.  Patient Location: Home Provider Location: Home Office   PCP:  Velna Hatchet, MD   [facility] Cardiologist:  Fransico Him, MD     Evaluation Performed:  Follow-Up Visit  Chief Complaint:  Chest pain  History of Present Illness:    Cathy Tucker is a 76 y.o. female with a history of migraine headaches, anxiety and prior COVID who was referred to me back in the fall with complaints of chest pain and palpitations.  The palpitations been going on for a few years and the chest pain felt like a tooth ache in the midsternal region that would come and go and was nonexertional.  2D echo on 10/10/2021 showed normal LV function with EF 55 to 60% with normal RV.  There was mild MR. Event monitor showed normal rhythm with nonsustained atrial tachycardia up to 12 seconds in duration along with PACs, atrial couplets and atrial triplets.  There were also occasional PVCs with PVC load less than 1%.I showed a coronary calcium score of 0 with minimal (less than 25%) plaque in the OM1.  It was felt her chest pain was noncardiac in origin.  She was started on Toprol XL 12.5mg  qhs.  She is here today for followup and  is doing well.  She denies any chest pain or pressure, SOB, DOE, PND, orthopnea, LE edema or syncope.  She still has occasional palpitations but these have significantly improved on Toprol. She occasionally has some mild dizziness if she is walking for a while but describes it has being off balance with the room.  She is compliant with her meds and is tolerating meds with no SE.     The patient does not have symptoms concerning for COVID-19 infection (fever, chills, cough, or new shortness of breath).    Past Medical History:  Diagnosis Date   Allergic rhinitis    Athlete's foot    CAD (coronary artery disease), native coronary artery    Coronary CTA with coronary calcium score 0 and less than 25% LM plaque in 2022   COVID    Fracture of humerus, proximal, right, closed 11/22/2015   HLD (hyperlipidemia)    Knee pain    Migraines    Osteopenia    Palpitations    PAT (paroxysmal atrial tachycardia) ([place])    Noted on event monitor 2022   PVCs (premature ventricular contractions)    Noted on event monitor 2022   Situational anxiety    Past Surgical History:  Procedure Laterality Date   ABDOMINAL HYSTERECTOMY     CARPAL TUNNEL WITH CUBITAL TUNNEL     HAND SURGERY     ORIF HUMERUS FRACTURE Right 11/22/2015   Procedure: OPEN REDUCTION INTERNAL FIXATION (  ORIF) RIGHT PROXIMAL HUMERUS FRACTURE;  Surgeon: Marchia Bond, MD;  Location: [facility];  Service: Orthopedics;  Laterality: Right;   ulnar nerve decompression       Current Meds  Medication Sig   albuterol (VENTOLIN HFA) 108 (90 Base) MCG/ACT inhaler Inhale 2 puffs into the lungs daily.   Calcium Carb-Cholecalciferol (CALCIUM 1000 + D) 1000-20 MG-MCG TABS daily.   Cyanocobalamin (B-12) 1000 MCG CAPS daily.   fexofenadine (ALLEGRA) 180 MG tablet Take 180 mg by mouth daily.   fluticasone (FLONASE) 50 MCG/ACT nasal spray Place 2 sprays into both nostrils daily.   metoprolol succinate (TOPROL XL) 25 MG 24 hr tablet Take  0.5 tablets (12.5 mg total) by mouth daily.   montelukast (SINGULAIR) 10 MG tablet Take 10 mg by mouth at bedtime.     Allergies:   Tetracyclines & related and Penicillins   Social History   Tobacco Use   Smoking status: Never   Smokeless tobacco: Never  Vaping Use   Vaping Use: Never used  Substance Use Topics   Alcohol use: No   Drug use: No     Family Hx: The patient's family history includes Arthritis in her paternal grandmother; Asthma in her sister; Brain cancer in her father; COPD in her sister; Depression in her sister; Diabetes Mellitus II in her paternal grandmother; Lung cancer in her father.  ROS:   Please see the history of present illness.     All other systems reviewed and are negative.   Prior CV studies:   The following studies were reviewed today:  2D echo, coronary CT, event monitor  Labs/Other Tests and Data Reviewed:    EKG:  No ECG reviewed.  Recent Labs: 08/19/2021: BUN 13; Creatinine, Ser 0.73; Potassium 4.5; Sodium 136 08/27/2021: ALT 23 10/10/2021: Magnesium 2.0; TSH 1.570   Recent Lipid Panel Lab Results  Component Value Date/Time   CHOL 182 08/27/2021 07:43 AM   TRIG 99 08/27/2021 07:43 AM   HDL 64 08/27/2021 07:43 AM   CHOLHDL 2.8 08/27/2021 07:43 AM   LDLCALC 100 (H) 08/27/2021 07:43 AM    Wt Readings from Last 3 Encounters:  11/17/21 175 lb (79.4 kg)  08/19/21 177 lb (80.3 kg)  12/23/17 172 lb (78 kg)     Risk Assessment/Calculations:          Objective:    Vital Signs:  BP 138/66    Pulse 70    Ht 5\' 3"  (1.6 m)    Wt 175 lb (79.4 kg)    BMI 31.00 kg/m    VITAL SIGNS:  reviewed GEN:  no acute distress EYES:  sclerae anicteric, EOMI - Extraocular Movements Intact RESPIRATORY:  normal respiratory effort, symmetric expansion CARDIOVASCULAR:  no peripheral edema SKIN:  no rash, lesions or ulcers. MUSCULOSKELETAL:  no obvious deformities. NEURO:  alert and oriented x 3, no obvious focal deficit PSYCH:  normal  affect  ASSESSMENT & PLAN:    Palpitations/nonsustained atrial tachycardia/PVCs -Event monitor showed normal rhythm with nonsustained atrial tachycardia up to 12 seconds in duration along with PACs, atrial couplets and triplets and occasional PVCs with PVC load less than 1% -She was started on Toprol XL 12.5mg  daily which has significantly improved her palpitations -continue prescription drug management with Toprol XL 12.5mg  daily with PRN refills.   2.  Chest pain/ASCAD -This is noncardiac and felt to likely be musculoskeletal -2D echo showed normal LV function -coronary CTA showed coronary calcium score of 0 with <25% plaque in the  OM1 -Continue therapy with statin  3.  Hyperlipidemia -LDL goal less than 70 -LDL was 100 in Oct 2022 -She was supposed to start atorvastatin but wanted to wait because she has a lot of joint problems -she wants to try diet first and I will repeat FLP and ALT in 4 months    COVID-19 Education: The signs and symptoms of COVID-19 were discussed with the patient and how to seek care for testing (follow up with PCP or arrange E-visit).  The importance of social distancing was discussed today.  Time:   Today, I have spent 20 minutes with the patient with telehealth technology discussing the above problems.     Medication Adjustments/Labs and Tests Ordered: Current medicines are reviewed at length with the patient today.  Concerns regarding medicines are outlined above.   Tests Ordered: No orders of the defined types were placed in this encounter.   Medication Changes: No orders of the defined types were placed in this encounter.   Follow Up:  In Person in 1 year(s)  Signed, Fransico Him, MD  11/17/2021 9:22 AM    [place]

## 2021-11-27 ENCOUNTER — Other Ambulatory Visit: Payer: Medicare Other

## 2021-12-05 DIAGNOSIS — M654 Radial styloid tenosynovitis [de Quervain]: Secondary | ICD-10-CM | POA: Diagnosis not present

## 2021-12-05 DIAGNOSIS — M545 Low back pain, unspecified: Secondary | ICD-10-CM | POA: Diagnosis not present

## 2022-01-02 DIAGNOSIS — M545 Low back pain, unspecified: Secondary | ICD-10-CM | POA: Diagnosis not present

## 2022-01-02 DIAGNOSIS — M542 Cervicalgia: Secondary | ICD-10-CM | POA: Diagnosis not present

## 2022-02-10 DIAGNOSIS — Z79899 Other long term (current) drug therapy: Secondary | ICD-10-CM | POA: Diagnosis not present

## 2022-02-17 DIAGNOSIS — Z1339 Encounter for screening examination for other mental health and behavioral disorders: Secondary | ICD-10-CM | POA: Diagnosis not present

## 2022-02-17 DIAGNOSIS — R079 Chest pain, unspecified: Secondary | ICD-10-CM | POA: Diagnosis not present

## 2022-02-17 DIAGNOSIS — L237 Allergic contact dermatitis due to plants, except food: Secondary | ICD-10-CM | POA: Diagnosis not present

## 2022-02-17 DIAGNOSIS — Z1331 Encounter for screening for depression: Secondary | ICD-10-CM | POA: Diagnosis not present

## 2022-02-17 DIAGNOSIS — Z0000 Encounter for general adult medical examination without abnormal findings: Secondary | ICD-10-CM | POA: Diagnosis not present

## 2022-02-17 DIAGNOSIS — R1013 Epigastric pain: Secondary | ICD-10-CM | POA: Diagnosis not present

## 2022-02-17 DIAGNOSIS — E669 Obesity, unspecified: Secondary | ICD-10-CM | POA: Diagnosis not present

## 2022-02-17 DIAGNOSIS — R002 Palpitations: Secondary | ICD-10-CM | POA: Diagnosis not present

## 2022-02-17 DIAGNOSIS — E785 Hyperlipidemia, unspecified: Secondary | ICD-10-CM | POA: Diagnosis not present

## 2022-02-17 DIAGNOSIS — R7989 Other specified abnormal findings of blood chemistry: Secondary | ICD-10-CM | POA: Diagnosis not present

## 2022-02-18 ENCOUNTER — Other Ambulatory Visit: Payer: Self-pay | Admitting: Internal Medicine

## 2022-02-18 DIAGNOSIS — R7989 Other specified abnormal findings of blood chemistry: Secondary | ICD-10-CM

## 2022-02-23 ENCOUNTER — Ambulatory Visit
Admission: RE | Admit: 2022-02-23 | Discharge: 2022-02-23 | Disposition: A | Discharge status: Home / Self Care | Payer: Medicare Other | Source: Ambulatory Visit | Attending: Internal Medicine | Admitting: Internal Medicine

## 2022-02-23 DIAGNOSIS — R7989 Other specified abnormal findings of blood chemistry: Secondary | ICD-10-CM

## 2022-02-23 DIAGNOSIS — R945 Abnormal results of liver function studies: Secondary | ICD-10-CM | POA: Diagnosis not present

## 2022-03-09 ENCOUNTER — Other Ambulatory Visit (HOSPITAL_COMMUNITY): Payer: Self-pay | Admitting: Plastic Surgery

## 2022-03-09 ENCOUNTER — Other Ambulatory Visit: Payer: Self-pay | Admitting: Internal Medicine

## 2022-03-09 ENCOUNTER — Other Ambulatory Visit: Payer: Self-pay | Admitting: Plastic Surgery

## 2022-03-09 DIAGNOSIS — R7989 Other specified abnormal findings of blood chemistry: Secondary | ICD-10-CM

## 2022-03-09 DIAGNOSIS — R109 Unspecified abdominal pain: Secondary | ICD-10-CM

## 2022-03-10 DIAGNOSIS — M654 Radial styloid tenosynovitis [de Quervain]: Secondary | ICD-10-CM | POA: Diagnosis not present

## 2022-03-10 DIAGNOSIS — M79645 Pain in left finger(s): Secondary | ICD-10-CM | POA: Diagnosis not present

## 2022-03-11 ENCOUNTER — Ambulatory Visit (HOSPITAL_COMMUNITY)
Admission: RE | Admit: 2022-03-11 | Discharge: 2022-03-11 | Disposition: A | Discharge status: Home / Self Care | Payer: Medicare Other | Source: Ambulatory Visit | Attending: Internal Medicine | Admitting: Internal Medicine

## 2022-03-11 DIAGNOSIS — R7989 Other specified abnormal findings of blood chemistry: Secondary | ICD-10-CM | POA: Insufficient documentation

## 2022-03-11 DIAGNOSIS — R1011 Right upper quadrant pain: Secondary | ICD-10-CM | POA: Diagnosis not present

## 2022-03-11 DIAGNOSIS — R109 Unspecified abdominal pain: Secondary | ICD-10-CM | POA: Insufficient documentation

## 2022-03-11 DIAGNOSIS — R12 Heartburn: Secondary | ICD-10-CM | POA: Diagnosis not present

## 2022-03-11 MED ORDER — TECHNETIUM TC 99M MEBROFENIN IV KIT
5.4000 | PACK | Freq: Once | INTRAVENOUS | Status: AC | PRN
  Administered 2022-03-11: 5.4 via INTRAVENOUS

## 2022-03-17 ENCOUNTER — Other Ambulatory Visit: Payer: Medicare Other

## 2022-04-09 ENCOUNTER — Other Ambulatory Visit: Payer: Self-pay | Admitting: Internal Medicine

## 2022-04-09 DIAGNOSIS — Z1231 Encounter for screening mammogram for malignant neoplasm of breast: Secondary | ICD-10-CM

## 2022-05-04 ENCOUNTER — Ambulatory Visit
Admission: RE | Admit: 2022-05-04 | Discharge: 2022-05-04 | Disposition: A | Discharge status: Home / Self Care | Payer: Medicare Other | Source: Ambulatory Visit | Attending: Internal Medicine | Admitting: Internal Medicine

## 2022-05-04 DIAGNOSIS — Z1231 Encounter for screening mammogram for malignant neoplasm of breast: Secondary | ICD-10-CM | POA: Diagnosis not present

## 2022-05-15 DIAGNOSIS — E785 Hyperlipidemia, unspecified: Secondary | ICD-10-CM | POA: Diagnosis not present

## 2022-05-15 DIAGNOSIS — C44729 Squamous cell carcinoma of skin of left lower limb, including hip: Secondary | ICD-10-CM | POA: Diagnosis not present

## 2022-05-15 DIAGNOSIS — B351 Tinea unguium: Secondary | ICD-10-CM | POA: Diagnosis not present

## 2022-05-15 DIAGNOSIS — D225 Melanocytic nevi of trunk: Secondary | ICD-10-CM | POA: Diagnosis not present

## 2022-05-15 DIAGNOSIS — Z1283 Encounter for screening for malignant neoplasm of skin: Secondary | ICD-10-CM | POA: Diagnosis not present

## 2022-06-19 DIAGNOSIS — B0089 Other herpesviral infection: Secondary | ICD-10-CM | POA: Diagnosis not present

## 2022-06-19 DIAGNOSIS — L928 Other granulomatous disorders of the skin and subcutaneous tissue: Secondary | ICD-10-CM | POA: Diagnosis not present

## 2022-06-19 DIAGNOSIS — Z08 Encounter for follow-up examination after completed treatment for malignant neoplasm: Secondary | ICD-10-CM | POA: Diagnosis not present

## 2022-06-19 DIAGNOSIS — Z85828 Personal history of other malignant neoplasm of skin: Secondary | ICD-10-CM | POA: Diagnosis not present

## 2022-07-10 DIAGNOSIS — M170 Bilateral primary osteoarthritis of knee: Secondary | ICD-10-CM | POA: Diagnosis not present

## 2022-07-14 DIAGNOSIS — H2513 Age-related nuclear cataract, bilateral: Secondary | ICD-10-CM | POA: Diagnosis not present

## 2022-07-14 DIAGNOSIS — H5213 Myopia, bilateral: Secondary | ICD-10-CM | POA: Diagnosis not present

## 2022-07-14 DIAGNOSIS — H353131 Nonexudative age-related macular degeneration, bilateral, early dry stage: Secondary | ICD-10-CM | POA: Diagnosis not present

## 2022-07-20 DIAGNOSIS — M170 Bilateral primary osteoarthritis of knee: Secondary | ICD-10-CM | POA: Diagnosis not present

## 2022-07-20 DIAGNOSIS — M1811 Unilateral primary osteoarthritis of first carpometacarpal joint, right hand: Secondary | ICD-10-CM | POA: Diagnosis not present

## 2022-07-24 DIAGNOSIS — M170 Bilateral primary osteoarthritis of knee: Secondary | ICD-10-CM | POA: Diagnosis not present

## 2022-09-14 DIAGNOSIS — Z1152 Encounter for screening for COVID-19: Secondary | ICD-10-CM | POA: Diagnosis not present

## 2022-09-14 DIAGNOSIS — J3089 Other allergic rhinitis: Secondary | ICD-10-CM | POA: Diagnosis not present

## 2022-09-14 DIAGNOSIS — U071 COVID-19: Secondary | ICD-10-CM | POA: Diagnosis not present

## 2022-09-16 ENCOUNTER — Other Ambulatory Visit: Payer: Self-pay | Admitting: Cardiology

## 2022-12-17 DIAGNOSIS — Z08 Encounter for follow-up examination after completed treatment for malignant neoplasm: Secondary | ICD-10-CM | POA: Diagnosis not present

## 2022-12-17 DIAGNOSIS — Z85828 Personal history of other malignant neoplasm of skin: Secondary | ICD-10-CM | POA: Diagnosis not present

## 2023-01-07 NOTE — Progress Notes (Signed)
Cardiology Office Note:    Date:  01/11/2023   ID:  Cathy Tucker, [place] [birth year], MRN [id number]  PCP:  Velna Hatchet, MD   [facility] Providers Cardiologist:  Fransico Him, MD     Referring MD: Velna Hatchet, MD   Chief Complaint:   History of Present Illness:    Cathy Tucker is a very pleasant 77 y.o. female with a hx of palpitations, migraine headaches, HLD, anxiety, and mild MR.  Referred to cardiology and seen by Dr. Tamala Julian 12/23/2017 for evaluation of palpitations and dyspnea on exertion. She reported episodes of rapid heart rate lasting 2 to 3 minutes, episodes occurring as frequently as 3 times per week. May occasionally go longer than a week without episodes. No episode has lasted longer than 5 minutes. No lightheadedness, dizziness, chest discomfort or dyspnea associated. Noting dyspnea on exertion with inclines.  No edema, orthopnea, PND.  TTE 01/04/2018 revealed normal LVEF, no significant valve disease.  Cardiac monitor completed 02/12/2018 revealed underlying NSR with palpitations that correlate with nonsustained atrial runs. No atrial fibrillation or flutter.   Seen again in clinic 08/19/21 by Dr. Radford Pax, referred for evaluation of chest pain and palpitations by PCP. She reported palpitations had worsened recently. Having chest pain like a tooth ache in the midsternal region that is very sporadic.  It occurred with both activity and rest. When at beach, walking in deep sand she had more significant period of CP. Coronary CTA 09/2021 with coronary calcium score 0 and less than 25% LM plaque.  Cardiac monitor completed 09/17/2021 revealed predominant normal sinus rhythm with average HR 72 bpm.  Nonsustained atrial tachycardia up to 12.3 seconds. Occasional PACs, atrial couplets, and atrial triplets.  Occasional PVCs and rare ventricular couplets. TTE 10/10/21 revealed normal LVEF with mild MR. She was started on Toprol XL 12.5 mg daily.  Last cardiology clinic  visit was via telemedicine with Dr. Radford Pax on 11/17/21. She reported palpitations had improved on Toprol. Discussion regarding starting statin for elevated LDL of 100, goal < 70.  She wanted to work on diet prior to starting medication and was scheduled to have labs 4 months later. One year follow-up was recommended.  Today, she is here alone for follow-up. Reports she is having occasional dizziness for at least a couple of months. Typically occurs when she is just walking around, not with exercise or with position change. Cannot tell that there is any reason. No associated visual changes. On at least one occasion she thought she might vomit. Feels swimmy headed, room not spinning. No presyncope or syncope. Has recorded SBPs 102, 108, 121 mmHg during episodes of dizziness. On another occasion in the evening when dizziness occurred SBP was 144. Does not feel irregular HR. Feels that she is struggling to get her breath at times. Continues to walk 1 1/2 to 2 miles, does water aerobics, [place] [facility] [employer] [facility] with no symptoms of chest pain, dyspnea, dizziness. Has never had to stop her workout due to dizziness. Balance is good. Feels heart luttering on occaison, better since starting on metoprolol. Admits that she eats a lot of sugar and bread. Planning to have fasting labs in May with PCP.   Past Medical History:  Diagnosis Date   Allergic rhinitis    Athlete's foot    CAD (coronary artery disease), native coronary artery    Coronary CTA with coronary calcium score 0 and less than 25% LM plaque in  2022   COVID    Fracture of humerus, proximal, right, closed 11/22/2015   HLD (hyperlipidemia)    Knee pain    Migraines    Osteopenia    Palpitations    PAT (paroxysmal atrial tachycardia)    Noted on event monitor 2022   PVCs (premature ventricular contractions)    Noted on event monitor 2022   Situational anxiety     Past Surgical History:  Procedure Laterality Date    ABDOMINAL HYSTERECTOMY     CARPAL TUNNEL WITH CUBITAL TUNNEL     HAND SURGERY     ORIF HUMERUS FRACTURE Right 11/22/2015   Procedure: OPEN REDUCTION INTERNAL FIXATION (ORIF) RIGHT PROXIMAL HUMERUS FRACTURE;  Surgeon: Marchia Bond, MD;  Location: [facility];  Service: Orthopedics;  Laterality: Right;   ulnar nerve decompression      Current Medications: Current Meds  Medication Sig   albuterol (VENTOLIN HFA) 108 (90 Base) MCG/ACT inhaler Inhale 2 puffs into the lungs daily.   Calcium Carb-Cholecalciferol (CALCIUM 1000 + D) 1000-20 MG-MCG TABS daily.   Cyanocobalamin (B-12) 1000 MCG CAPS daily.   fexofenadine (ALLEGRA) 180 MG tablet Take 180 mg by mouth daily.   fluticasone (FLONASE) 50 MCG/ACT nasal spray Place 2 sprays into both nostrils daily.   metoprolol succinate (TOPROL-XL) 25 MG 24 hr tablet Take 0.5 tablets (12.5 mg total) by mouth daily. Please call [phone] to schedule an appointment for future refills. Thank you.   montelukast (SINGULAIR) 10 MG tablet Take 10 mg by mouth at bedtime.     Allergies:   Tetracyclines & related and Penicillins   Social History   Socioeconomic History   Marital status: Married    Spouse name: Not on file   Number of children: 2   Years of education: Not on file   Highest education level: Not on file  Occupational History   Occupation: adult daycare  Tobacco Use   Smoking status: Never   Smokeless tobacco: Never  Vaping Use   Vaping Use: Never used  Substance and Sexual Activity   Alcohol use: No   Drug use: No   Sexual activity: Not on file  Other Topics Concern   Not on file  Social History Narrative   Not on file   Social Determinants of Health   Financial Resource Strain: Not on file  Food Insecurity: Not on file  Transportation Needs: Not on file  Physical Activity: Not on file  Stress: Not on file  Social Connections: Not on file     Family History: The patient's family history includes Arthritis in  her paternal grandmother; Asthma in her sister; Brain cancer in her father; COPD in her sister; Depression in her sister; Diabetes Mellitus II in her paternal grandmother; Lung cancer in her father.  ROS:   Please see the history of present illness.    + dizziness All other systems reviewed and are negative.  Labs/Other Studies Reviewed:    The following studies were reviewed today:  Cardiac Studies & Procedures        ECHOCARDIOGRAM COMPLETE 10/10/2021   1. Left ventricular ejection fraction, by estimation, is 55 to 60%. The left ventricle has normal function. The left ventricle has no regional wall motion abnormalities. Left ventricular diastolic parameters are indeterminate. 2. Right ventricular systolic function is normal. The right ventricular size is normal. There is normal pulmonary artery systolic pressure. 3. The mitral valve is normal in structure. Mild mitral valve regurgitation. 4. The aortic valve is  tricuspid. Aortic valve regurgitation is trivial. Aortic valve sclerosis is present, with no evidence of aortic valve stenosis.     MONITORS LONG TERM MONITOR (3-14 DAYS) 09/17/2021  Narrative  Predominant rhythm is normal sinus rhythm with an average heart rate of 72 bpm and ranged from 49 to 190 bpm.  Nonsustained atrial tachycardia up to 12.3 seconds in duration with fastest heart rate 190 bpm. There were also occasional PACs, atrial couplets and atrial triplets.  Occasional PVCs and rare ventricular couplets. There was also ventricular bigeminy and trigeminy present with PVC load less than 1%   Patch Wear Time:  14 days and 0 hours (2022-10-26T06:45:35-0400 to 2022-11-09T05:45:39-0500)  Patient had a min HR of 49 bpm, max HR of 190 bpm, and avg HR of 72 bpm. Predominant underlying rhythm was Sinus Rhythm. 11 Supraventricular Tachycardia runs occurred, the run with the fastest interval lasting 7 beats with a max rate of 190 bpm, the longest lasting 12.3 secs with an  avg rate of 111 bpm. Supraventricular Tachycardia was detected within +/- 45 seconds of symptomatic patient event(s). Isolated SVEs were rare (<1.0%), SVE Couplets were rare (<1.0%), and SVE Triplets were rare (<1.0%). Isolated VEs were rare (<1.0%), VE Couplets were rare (<1.0%), and no VE Triplets were present. Ventricular Bigeminy and Trigeminy were present.   CT SCANS  CT CORONARY MORPH W/CTA COR W/SCORE 09/02/2021  Addendum 09/02/2021  8:19 AM ADDENDUM REPORT: 09/02/2021 08:16  EXAM: OVER-READ INTERPRETATION  CT CHEST  The following report is an over-read performed by radiologist Dr. Barnetta Hammersmith [facility] on 09/02/2021. This over-read does not include interpretation of cardiac or coronary anatomy or pathology. The coronary CTA interpretation by the cardiologist is attached.  COMPARISON:  None.  FINDINGS: No significant noncardiac vascular findings. Visualized mediastinum and hilar regions demonstrate no lymphadenopathy or abnormal masses. There is a small hiatal hernia. Visualized lungs show no evidence of pulmonary edema, consolidation, pneumothorax, nodule or pleural fluid. Visualized upper abdomen and bony structures are unremarkable.  IMPRESSION: Small hiatal hernia.    Aorta: Normal size. Ascending aorta 3.2 cm. No calcifications. No dissection.  Aortic Valve:  Trileaflet.  No calcifications.  Coronary Arteries:  Normal coronary origin.  Right dominance.  RCA is a large dominant artery that gives rise to PDA and two PLV branches. There is no plaque.  Left main is a large artery that gives rise to LAD and LCX arteries.  LAD is a large vessel that has no plaque. There three diagonal vessels without plaque.  LCX is a non-dominant artery that gives rise to one small OM branch. There is minimal plaque in OM1.  Coronary Calcium Score: 0  Other findings:  Normal pulmonary vein drainage into the left atrium.  Normal let atrial appendage  without a thrombus.  Normal size of the pulmonary artery.  IMPRESSION: 1. Coronary calcium score of 0. This was 0 percentile for age-, race-, and sex-matched controls.  2. Normal coronary origin with right dominance.  3. There is minimal (<25%) plaque in OM1.  CAD-RADS 1.  4.  Consider non-cardiac causes of chest pain.  Interpretation of the non-cardiac thoracic structures by Radiology is pending.           Recent Labs: No results found for requested labs within last 365 days.  Recent Lipid Panel    Component Value Date/Time   CHOL 182 08/27/2021 0743   TRIG 99 08/27/2021 0743   HDL 64 08/27/2021 0743   CHOLHDL 2.8 08/27/2021 0743  LDLCALC 100 (H) 08/27/2021 0743     Risk Assessment/Calculations:      Physical Exam:    VS:  BP (!) 140/80   Pulse 67   Ht '5\' 3"'$  (1.6 m)   Wt 179 lb 3.2 oz (81.3 kg)   SpO2 94%   BMI 31.74 kg/m     Wt Readings from Last 3 Encounters:  01/11/23 179 lb 3.2 oz (81.3 kg)  11/17/21 175 lb (79.4 kg)  08/19/21 177 lb (80.3 kg)     GEN:  Well nourished, well developed in no acute distress HEENT: Normal NECK: No JVD; No carotid bruits CARDIAC: RRR, no murmurs, rubs, gallops RESPIRATORY:  Clear to auscultation without rales, wheezing or rhonchi  ABDOMEN: Soft, non-tender, non-distended MUSCULOSKELETAL:  No edema; No deformity. 2+ pedal pulses, equal bilaterally SKIN: Warm and dry NEUROLOGIC:  Alert and oriented x 3 PSYCHIATRIC:  Normal affect   EKG:  EKG is ordered today.  The ekg ordered today demonstrates normal sinus rhythm at 71 bpm, septal infarct age undetermined, no acute change from previous tracing  HYPERTENSION CONTROL Vitals:   01/11/23 1027 01/11/23 1046  BP: (!) 158/80 (!) 140/80    The patient's blood pressure is elevated above target today.  In order to address the patient's elevated BP: Blood pressure will be monitored at home to determine if medication changes need to be made.    Diagnoses:    1.  Dizziness   2. Palpitations   3. PVC's (premature ventricular contractions)   4. Nonrheumatic mitral valve regurgitation   5. Coronary artery disease involving native coronary artery of native heart without angina pectoris   6. Hyperlipidemia LDL goal <70    Assessment and Plan:     Dizziness: Recent dizziness x 1 month. No obvious culprit, no visual changes. Description does not sound consistent with vertigo. Will get carotid ultrasound to rule out stenosis that may be contributing. Discussion regarding repeating cardiac monitor to r/o arrhythmia, however she feels palpitations have overall improved. Will await carotid results and see her back in 2 months.   Hypertension: BP elevated in clinic today. Occasional episodes of soft BP as well as recent c/o dizziness. Will continue to monitor BP for now.   Palpitations: Occasional flutters. Feels better since starting metoprolol. We discussed repeating cardiac monitor in the setting of dizziness, however she feels palpitations have overall improved and would like to hold off on repeating monitor at this time. Continue metoprolol.  Mitral regurgitation: Mild MR with normal LVEF on echo 10/2021. She is asymptomatic. Continue to monitor clinically for now.   CAD without angina: Nonobstructive CAD with < 25% plaque in LM, CAC 0 on CCTA 09/2021. She denies chest pain, dyspnea, or other symptoms concerning for angina.  No indication for further ischemic evaluation at this time. Needs to work on lower LDL as noted below.   Hyperlipidemia LDL goal < 70: LDL 111 on 02/10/22. Plans to have repeat lipid testing with PCP.  Lifestyle management including 150 minutes moderate intensity exercise and heart healthy, mostly plant-based diet emphasized. Would recommend moderate intensity statin therapy in the setting of CAC.      Disposition: 2 months with me  Medication Adjustments/Labs and Tests Ordered: Current medicines are reviewed at length with the patient  today.  Concerns regarding medicines are outlined above.  Orders Placed This Encounter  Procedures   EKG 12-Lead   VAS US CAROTID   No orders of the defined types were placed in this encounter.  Patient Instructions  Medication Instructions:   Your physician recommends that you continue on your current medications as directed. Please refer to the Current Medication list given to you today.   *If you need a refill on your cardiac medications before your next appointment, please call your pharmacy*   Lab Work:  None ordered.  If you have labs (blood work) drawn today and your tests are completely normal, you will receive your results only by: [place] (if you have MyChart) OR A paper copy in the mail If you have any lab test that is abnormal or we need to change your treatment, we will call you to review the results.   Testing/Procedures:  Your physician has requested that you have a carotid duplex. This test is an ultrasound of the carotid arteries in your neck. It looks at blood flow through these arteries that supply the brain with blood. Allow one hour for this exam. There are no restrictions or special instructions.    Follow-Up: At [facility], you and your health needs are our priority.  As part of our continuing mission to provide you with exceptional heart care, we have created designated Provider Care Teams.  These Care Teams include your primary Cardiologist (physician) and Advanced Practice Providers (APPs -  Physician Assistants and Nurse Practitioners) who all work together to provide you with the care you need, when you need it.  We recommend signing up for the patient portal called "MyChart".  Sign up information is provided on this After Visit Summary.  MyChart is used to connect with patients for Virtual Visits (Telemedicine).  Patients are able to view lab/test results, encounter notes, upcoming appointments, etc.  Non-urgent messages can be  sent to your provider as well.   To learn more about what you can do with MyChart, go to [email or web address].    Your next appointment:   2 month(s)  Provider:   Christen Bame, NP            Signed, Emmaline Life, NP  01/11/2023 1:28 PM    [place]

## 2023-01-11 ENCOUNTER — Ambulatory Visit: Payer: Medicare Other | Attending: Nurse Practitioner | Admitting: Nurse Practitioner

## 2023-01-11 ENCOUNTER — Encounter: Payer: Self-pay | Admitting: Nurse Practitioner

## 2023-01-11 VITALS — BP 140/80 | HR 67 | Ht 63.0 in | Wt 179.2 lb

## 2023-01-11 DIAGNOSIS — I493 Ventricular premature depolarization: Secondary | ICD-10-CM | POA: Insufficient documentation

## 2023-01-11 DIAGNOSIS — I2510 Atherosclerotic heart disease of native coronary artery without angina pectoris: Secondary | ICD-10-CM | POA: Diagnosis not present

## 2023-01-11 DIAGNOSIS — R42 Dizziness and giddiness: Secondary | ICD-10-CM | POA: Diagnosis not present

## 2023-01-11 DIAGNOSIS — R002 Palpitations: Secondary | ICD-10-CM | POA: Diagnosis not present

## 2023-01-11 DIAGNOSIS — E785 Hyperlipidemia, unspecified: Secondary | ICD-10-CM | POA: Diagnosis not present

## 2023-01-11 DIAGNOSIS — I340 Nonrheumatic mitral (valve) insufficiency: Secondary | ICD-10-CM | POA: Insufficient documentation

## 2023-01-11 NOTE — Patient Instructions (Signed)
Medication Instructions:   Your physician recommends that you continue on your current medications as directed. Please refer to the Current Medication list given to you today.   *If you need a refill on your cardiac medications before your next appointment, please call your pharmacy*   Lab Work:  None ordered.  If you have labs (blood work) drawn today and your tests are completely normal, you will receive your results only by: [place] (if you have MyChart) OR A paper copy in the mail If you have any lab test that is abnormal or we need to change your treatment, we will call you to review the results.   Testing/Procedures:  Your physician has requested that you have a carotid duplex. This test is an ultrasound of the carotid arteries in your neck. It looks at blood flow through these arteries that supply the brain with blood. Allow one hour for this exam. There are no restrictions or special instructions.    Follow-Up: At [facility], you and your health needs are our priority.  As part of our continuing mission to provide you with exceptional heart care, we have created designated Provider Care Teams.  These Care Teams include your primary Cardiologist (physician) and Advanced Practice Providers (APPs -  Physician Assistants and Nurse Practitioners) who all work together to provide you with the care you need, when you need it.  We recommend signing up for the patient portal called "MyChart".  Sign up information is provided on this After Visit Summary.  MyChart is used to connect with patients for Virtual Visits (Telemedicine).  Patients are able to view lab/test results, encounter notes, upcoming appointments, etc.  Non-urgent messages can be sent to your provider as well.   To learn more about what you can do with MyChart, go to [email or web address].    Your next appointment:   2 month(s)  Provider:   Christen Bame, NP

## 2023-01-19 ENCOUNTER — Ambulatory Visit (HOSPITAL_COMMUNITY)
Admission: RE | Admit: 2023-01-19 | Discharge: 2023-01-19 | Disposition: A | Discharge status: Home / Self Care | Payer: Medicare Other | Source: Ambulatory Visit | Attending: Cardiovascular Disease | Admitting: Cardiovascular Disease

## 2023-01-19 DIAGNOSIS — I493 Ventricular premature depolarization: Secondary | ICD-10-CM | POA: Insufficient documentation

## 2023-01-19 DIAGNOSIS — R002 Palpitations: Secondary | ICD-10-CM | POA: Insufficient documentation

## 2023-01-19 DIAGNOSIS — R42 Dizziness and giddiness: Secondary | ICD-10-CM | POA: Diagnosis not present

## 2023-02-10 DIAGNOSIS — M1811 Unilateral primary osteoarthritis of first carpometacarpal joint, right hand: Secondary | ICD-10-CM | POA: Diagnosis not present

## 2023-02-10 DIAGNOSIS — M79645 Pain in left finger(s): Secondary | ICD-10-CM | POA: Diagnosis not present

## 2023-02-10 DIAGNOSIS — M170 Bilateral primary osteoarthritis of knee: Secondary | ICD-10-CM | POA: Diagnosis not present

## 2023-02-17 DIAGNOSIS — M170 Bilateral primary osteoarthritis of knee: Secondary | ICD-10-CM | POA: Diagnosis not present

## 2023-02-24 DIAGNOSIS — M170 Bilateral primary osteoarthritis of knee: Secondary | ICD-10-CM | POA: Diagnosis not present

## 2023-03-16 DIAGNOSIS — E785 Hyperlipidemia, unspecified: Secondary | ICD-10-CM | POA: Diagnosis not present

## 2023-03-16 DIAGNOSIS — M8589 Other specified disorders of bone density and structure, multiple sites: Secondary | ICD-10-CM | POA: Diagnosis not present

## 2023-03-16 DIAGNOSIS — M8580 Other specified disorders of bone density and structure, unspecified site: Secondary | ICD-10-CM | POA: Diagnosis not present

## 2023-03-16 DIAGNOSIS — K219 Gastro-esophageal reflux disease without esophagitis: Secondary | ICD-10-CM | POA: Diagnosis not present

## 2023-03-16 LAB — LAB REPORT - SCANNED: EGFR: 60.9

## 2023-03-23 DIAGNOSIS — E669 Obesity, unspecified: Secondary | ICD-10-CM | POA: Diagnosis not present

## 2023-03-23 DIAGNOSIS — E785 Hyperlipidemia, unspecified: Secondary | ICD-10-CM | POA: Diagnosis not present

## 2023-03-23 DIAGNOSIS — Z0000 Encounter for general adult medical examination without abnormal findings: Secondary | ICD-10-CM | POA: Diagnosis not present

## 2023-03-23 DIAGNOSIS — N951 Menopausal and female climacteric states: Secondary | ICD-10-CM | POA: Diagnosis not present

## 2023-03-23 DIAGNOSIS — R002 Palpitations: Secondary | ICD-10-CM | POA: Diagnosis not present

## 2023-03-23 DIAGNOSIS — M810 Age-related osteoporosis without current pathological fracture: Secondary | ICD-10-CM | POA: Diagnosis not present

## 2023-03-23 DIAGNOSIS — J069 Acute upper respiratory infection, unspecified: Secondary | ICD-10-CM | POA: Diagnosis not present

## 2023-03-23 DIAGNOSIS — Z1331 Encounter for screening for depression: Secondary | ICD-10-CM | POA: Diagnosis not present

## 2023-03-23 DIAGNOSIS — R7989 Other specified abnormal findings of blood chemistry: Secondary | ICD-10-CM | POA: Diagnosis not present

## 2023-03-23 DIAGNOSIS — Z1339 Encounter for screening examination for other mental health and behavioral disorders: Secondary | ICD-10-CM | POA: Diagnosis not present

## 2023-03-23 DIAGNOSIS — R1013 Epigastric pain: Secondary | ICD-10-CM | POA: Diagnosis not present

## 2023-03-24 NOTE — Progress Notes (Addendum)
Cardiology Office Note:    Date:  03/26/2023   ID:  Cathy Tucker, [place] [birth year], MRN [id number]  PCP:  Alysia Penna, MD   [facility] Providers Cardiologist:  Armanda Magic, MD     Referring MD: Alysia Penna, MD   Chief Complaint:   History of Present Illness:    Cathy Tucker is a very pleasant 77 y.o. female with a hx of palpitations, migraine headaches, HLD, anxiety, and mild MR.  Referred to cardiology and seen by Dr. Katrinka Blazing 12/23/2017 for evaluation of palpitations and dyspnea on exertion. She reported episodes of rapid heart rate lasting 2 to 3 minutes, episodes occurring as frequently as 3 times per week. May occasionally go longer than a week without episodes. No episode has lasted longer than 5 minutes. No lightheadedness, dizziness, chest discomfort or dyspnea associated. Noting dyspnea on exertion with inclines.  No edema, orthopnea, PND.  TTE 01/04/2018 revealed normal LVEF, no significant valve disease.  Cardiac monitor completed 02/12/2018 revealed underlying NSR with palpitations that correlate with nonsustained atrial runs. No atrial fibrillation or flutter.   Seen again in clinic 08/19/21 by Dr. Mayford Knife, referred for evaluation of chest pain and palpitations by PCP. She reported palpitations had worsened recently. Having chest pain like a tooth ache in the midsternal region that is very sporadic.  It occurred with both activity and rest. When at beach, walking in deep sand she had more significant period of CP. Coronary CTA 09/2021 with coronary calcium score 0 and less than 25% LM plaque.  Cardiac monitor completed 09/17/2021 revealed predominant normal sinus rhythm with average HR 72 bpm.  Nonsustained atrial tachycardia up to 12.3 seconds. Occasional PACs, atrial couplets, and atrial triplets. Occasional PVCs and rare ventricular couplets. TTE 10/10/21 revealed normal LVEF with mild MR. She was started on Toprol XL 12.5 mg daily.  Last cardiology clinic  visit was via telemedicine with Dr. Mayford Knife on 11/17/21. She reported palpitations had improved on Toprol. Discussion regarding starting statin for elevated LDL of 100, goal < 70.  She wanted to work on diet prior to starting medication and was scheduled to have labs 4 months later. One year follow-up was recommended.  Seen in cardiology clinic by me on 01/11/23 for occasional dizziness for at least a couple of months. Typically occurs when she is just walking around, not with exercise or with position change. Cannot tell that there is any reason. No associated visual changes. On at least one occasion she thought she might vomit. Feels swimmy headed, room not spinning. No presyncope or syncope. Has recorded SBPs 102, 108, 121 mmHg during episodes of dizziness. On another occasion in the evening when dizziness occurred SBP was 144. Does not feel irregular HR. Feels that she is struggling to get her breath at times. Continues to walk 1 1/2 to 2 miles, does water aerobics, [place] [facility] [employer] [facility] with no symptoms of chest pain, dyspnea, dizziness. Has never had to stop her workout due to dizziness. Balance is good. Feels heart luttering on occaison, better since starting on metoprolol. Admits that she eats a lot of sugar and bread. Planning to have fasting labs in May with PCP.  We discussed possible cardiac monitor to rule out arrhythmias, however she felt palpitations had overall improved.  LDL was 111 on 02/10/2022.  Goal of LDL 70 or lower emphasized.  She had elected to work on lifestyle management and plan for repeat testing with PCP.  Plan for 2 mo follow-up. Carotid duplex obtained  01/19/23 which revealed no concerning stenosis.   Today, she is here alone for follow-up. Reports episodes of dizziness do not seem to occur as often as before. Walks 1 1/2 to 2 miles per day and does water aerobics several days per week.  Reports that during her walks when she is going up an incline she becomes  short of breath.  She is able to talk with her husband when this occurs and does not have chest discomfort, lightheadedness, presyncope, or syncope.  She states that she does stop to rest for a few seconds but cannot tell if this is because of the knee pain or the shortness of breath.  She likely needs knee replacement and is trying to postpone this as long as possible. No symptoms of shortness of breath at other times. No orthopnea, edema, or PND. BP at home is 120s -130s over 70s-80s. Attributes elevated BP during office visit to driving down [address].   Past Medical History:  Diagnosis Date   Allergic rhinitis    Athlete's foot    CAD (coronary artery disease), native coronary artery    Coronary CTA with coronary calcium score 0 and less than 25% LM plaque in 2022   COVID    Fracture of humerus, proximal, right, closed 11/22/2015   HLD (hyperlipidemia)    Knee pain    Migraines    Osteopenia    Palpitations    PAT (paroxysmal atrial tachycardia)    Noted on event monitor 2022   PVCs (premature ventricular contractions)    Noted on event monitor 2022   Situational anxiety     Past Surgical History:  Procedure Laterality Date   ABDOMINAL HYSTERECTOMY     CARPAL TUNNEL WITH CUBITAL TUNNEL     HAND SURGERY     ORIF HUMERUS FRACTURE Right 11/22/2015   Procedure: OPEN REDUCTION INTERNAL FIXATION (ORIF) RIGHT PROXIMAL HUMERUS FRACTURE;  Surgeon: Teryl Lucy, MD;  Location: [facility];  Service: Orthopedics;  Laterality: Right;   ulnar nerve decompression      Current Medications: Current Meds  Medication Sig   albuterol (VENTOLIN HFA) 108 (90 Base) MCG/ACT inhaler Inhale 2 puffs into the lungs daily.   Calcium Carb-Cholecalciferol (CALCIUM 1000 + D) 1000-20 MG-MCG TABS daily.   Cyanocobalamin (B-12) 1000 MCG CAPS daily.   fexofenadine (ALLEGRA) 180 MG tablet Take 180 mg by mouth daily.   fluticasone (FLONASE) 50 MCG/ACT nasal spray Place 2 sprays into both nostrils  daily.   montelukast (SINGULAIR) 10 MG tablet Take 10 mg by mouth at bedtime.   rosuvastatin (CRESTOR) 20 MG tablet Take 1 tablet (20 mg total) by mouth every evening.   [DISCONTINUED] metoprolol succinate (TOPROL-XL) 25 MG 24 hr tablet Take 0.5 tablets (12.5 mg total) by mouth daily. Please call [phone] to schedule an appointment for future refills. Thank you.     Allergies:   Tetracyclines & related and Penicillins   Social History   Socioeconomic History   Marital status: Married    Spouse name: Not on file   Number of children: 2   Years of education: Not on file   Highest education level: Not on file  Occupational History   Occupation: adult daycare  Tobacco Use   Smoking status: Never   Smokeless tobacco: Never  Vaping Use   Vaping Use: Never used  Substance and Sexual Activity   Alcohol use: No   Drug use: No   Sexual  activity: Not on file  Other Topics Concern   Not on file  Social History Narrative   Not on file   Social Determinants of Health   Financial Resource Strain: Not on file  Food Insecurity: Not on file  Transportation Needs: Not on file  Physical Activity: Not on file  Stress: Not on file  Social Connections: Not on file     Family History: The patient's family history includes Arthritis in her paternal grandmother; Asthma in her sister; Brain cancer in her father; COPD in her sister; Depression in her sister; Diabetes Mellitus II in her paternal grandmother; Lung cancer in her father.  ROS:   Please see the history of present illness.    + shortness of breath All other systems reviewed and are negative.  Labs/Other Studies Reviewed:    The following studies were reviewed today:  Cardiac Studies & Procedures    Carotid Duplex 01/19/23  Summary:  Right Carotid: There is no evidence of stenosis in the right ICA. The                 extracranial vessels were near-normal with only minimal  wall                thickening or plaque.    Left Carotid: There is no evidence of stenosis in the left ICA. The  extracranial               vessels were near-normal with only minimal wall thickening  or               plaque.   Vertebrals:  Bilateral vertebral arteries demonstrate antegrade flow.  Subclavians: Normal flow hemodynamics were seen in bilateral subclavian               arteries.   *See table(s) above for measurements and observations.  ECHOCARDIOGRAM COMPLETE 10/10/2021   1. Left ventricular ejection fraction, by estimation, is 55 to 60%. The left ventricle has normal function. The left ventricle has no regional wall motion abnormalities. Left ventricular diastolic parameters are indeterminate. 2. Right ventricular systolic function is normal. The right ventricular size is normal. There is normal pulmonary artery systolic pressure. 3. The mitral valve is normal in structure. Mild mitral valve regurgitation. 4. The aortic valve is tricuspid. Aortic valve regurgitation is trivial. Aortic valve sclerosis is present, with no evidence of aortic valve stenosis.     MONITORS LONG TERM MONITOR (3-14 DAYS) 09/17/2021  Narrative  Predominant rhythm is normal sinus rhythm with an average heart rate of 72 bpm and ranged from 49 to 190 bpm.  Nonsustained atrial tachycardia up to 12.3 seconds in duration with fastest heart rate 190 bpm. There were also occasional PACs, atrial couplets and atrial triplets.  Occasional PVCs and rare ventricular couplets. There was also ventricular bigeminy and trigeminy present with PVC load less than 1%   Patch Wear Time:  14 days and 0 hours (2022-10-26T06:45:35-0400 to 2022-11-09T05:45:39-0500)  Patient had a min HR of 49 bpm, max HR of 190 bpm, and avg HR of 72 bpm. Predominant underlying rhythm was Sinus Rhythm. 11 Supraventricular Tachycardia runs occurred, the run with the fastest interval lasting 7 beats with a max rate of 190 bpm, the longest lasting 12.3 secs with an avg rate of  111 bpm. Supraventricular Tachycardia was detected within +/- 45 seconds of symptomatic patient event(s). Isolated SVEs were rare (<1.0%), SVE Couplets were rare (<1.0%), and SVE Triplets were rare (<1.0%). Isolated VEs were rare (<  1.0%), VE Couplets were rare (<1.0%), and no VE Triplets were present. Ventricular Bigeminy and Trigeminy were present.   CT SCANS  CT CORONARY MORPH W/CTA COR W/SCORE 09/02/2021  Addendum 09/02/2021  8:19 AM ADDENDUM REPORT: 09/02/2021 08:16  EXAM: OVER-READ INTERPRETATION  CT CHEST  The following report is an over-read performed by radiologist Dr. Marinda [facility] on 09/02/2021. This over-read does not include interpretation of cardiac or coronary anatomy or pathology. The coronary CTA interpretation by the cardiologist is attached.  COMPARISON:  None.  FINDINGS: No significant noncardiac vascular findings. Visualized mediastinum and hilar regions demonstrate no lymphadenopathy or abnormal masses. There is a small hiatal hernia. Visualized lungs show no evidence of pulmonary edema, consolidation, pneumothorax, nodule or pleural fluid. Visualized upper abdomen and bony structures are unremarkable.  IMPRESSION: Small hiatal hernia.    Aorta: Normal size. Ascending aorta 3.2 cm. No calcifications. No dissection.  Aortic Valve:  Trileaflet.  No calcifications.  Coronary Arteries:  Normal coronary origin.  Right dominance.  RCA is a large dominant artery that gives rise to PDA and two PLV branches. There is no plaque.  Left main is a large artery that gives rise to LAD and LCX arteries.  LAD is a large vessel that has no plaque. There three diagonal vessels without plaque.  LCX is a non-dominant artery that gives rise to one small OM branch. There is minimal plaque in OM1.  Coronary Calcium Score: 0  Other findings:  Normal pulmonary vein drainage into the left atrium.  Normal let atrial appendage without a  thrombus.  Normal size of the pulmonary artery.  IMPRESSION: 1. Coronary calcium score of 0. This was 0 percentile for age-, race-, and sex-matched controls.  2. Normal coronary origin with right dominance.  3. There is minimal (<25%) plaque in OM1.  CAD-RADS 1.  4.  Consider non-cardiac causes of chest pain.  Interpretation of the non-cardiac thoracic structures by Radiology is pending.           Recent Labs: No results found for requested labs within last 365 days.  Recent Lipid Panel    Component Value Date/Time   CHOL 182 08/27/2021 0743   TRIG 99 08/27/2021 0743   HDL 64 08/27/2021 0743   CHOLHDL 2.8 08/27/2021 0743   LDLCALC 100 (H) 08/27/2021 0743     Risk Assessment/Calculations:      Physical Exam:    VS:  BP (!) 130/94   Pulse 64   Ht 5\' 3"  (1.6 m)   Wt 176 lb (79.8 kg)   SpO2 98%   BMI 31.18 kg/m     Wt Readings from Last 3 Encounters:  03/25/23 176 lb (79.8 kg)  01/11/23 179 lb 3.2 oz (81.3 kg)  11/17/21 175 lb (79.4 kg)     GEN:  Well nourished, well developed in no acute distress HEENT: Normal NECK: No JVD; No carotid bruits CARDIAC: RRR, no murmurs, rubs, gallops RESPIRATORY:  Clear to auscultation without rales, wheezing or rhonchi  ABDOMEN: Soft, non-tender, non-distended MUSCULOSKELETAL:  No edema; No deformity. 2+ pedal pulses, equal bilaterally SKIN: Warm and dry NEUROLOGIC:  Alert and oriented x 3 PSYCHIATRIC:  Normal affect   EKG:  EKG is not ordered today  HYPERTENSION CONTROL Vitals:   03/25/23 1404 03/25/23 1425  BP: (!) 128/90 (!) 130/94    The patient's blood pressure is elevated above target today.  In order to address the patient's elevated BP: Blood pressure will be monitored at home to  determine if medication changes need to be made.; The blood pressure is usually elevated in clinic.  Blood pressures monitored at home have been optimal.    Diagnoses:    1. Shortness of breath   2. PVC's (premature  ventricular contractions)   3. Coronary artery disease involving native coronary artery of native heart without angina pectoris   4. Hyperlipidemia LDL goal <70   5. Palpitations   6. Dizziness   7. Essential hypertension   8. Nonrheumatic mitral valve regurgitation     Assessment and Plan:     Shortness of breath: Reports shortness of breath with walking up an incline. She also has severe bilateral knee pain and stops at the top of an incline to rest - is unsure if breathing improves because pain improves or because of rest. During walk, she is still able to carry on a conversation with her husband. No orthopnea, PND, or edema. Symptoms do not occur at other times or with water aerobics. No chest pain. Consideration given to repeating echocardiogram, however she would like to continue to monitor for now.  Encouraged her to ask her husband to observe for worsening shortness of breath with exertion.   Dizziness: She reported worsening dizziness at office visit 01/11/23. Carotid duplex obtained to r/o stenosis that may be contributing and it showed no evidence of carotid, vertebral, or subclavian stenosis. Today, she reports that dizziness has improved. She is unsure of why, could not discern an obvious culprit. She continues to be physically active without presyncope or syncope. No indication for further testing at this time.   Hypertension: BP elevated in clinic today. Home BP is well controlled. Continue to monitor.    Palpitations/PVCs: Rare palpitations. Overall well controlled. Continue metoprolol.   Mitral regurgitation: Mild MR with normal LVEF on echo 10/2021. Having increased SOB but only with walking up hill. No chest pain. Palpitations are rare. Consider repeat echo if symptoms  worsen.   CAD without angina: Coronary CTA 09/2021 revealed nonobstructive CAD with < 25% plaque in LM, CAC 0. She denies chest pain, dyspnea, or other symptoms concerning for angina.  No indication for further  ischemic evaluation at this time. Emphasized the importance of secondary prevention including heart healthy, mostly plant-based diet, LDL goal less than 70, continued goal of at least 150 minutes of moderate intensity exercise each week.  Hyperlipidemia LDL goal < 70: LDL 117 on 1/61/09, normal apolipoprotein B. ASCVD risk score calculator is for individuals under 59 y/o. Emphasized the importance of LDL goal < 70 due to CAD. She is agreeable to start rosuvastatin 20 mg daily.  She is concerned about worsening knee pain.  Advised her to notify us if symptoms worsen.  Lifestyle management including 150 minutes moderate intensity exercise and heart healthy, mostly plant-based diet emphasized. Will recheck lipid panel/ALT in 2-3 months.     Disposition: 6 months with Dr. Mayford Knife or me  Medication Adjustments/Labs and Tests Ordered: Current medicines are reviewed at length with the patient today.  Concerns regarding medicines are outlined above.  Orders Placed This Encounter  Procedures   Lipid Profile   ALT   Meds ordered this encounter  Medications   rosuvastatin (CRESTOR) 20 MG tablet    Sig: Take 1 tablet (20 mg total) by mouth every evening.    Dispense:  90 tablet    Refill:  3   metoprolol succinate (TOPROL-XL) 25 MG 24 hr tablet    Sig: Take 0.5 tablets (12.5 mg total) by mouth  daily.    Dispense:  45 tablet    Refill:  3    Patient Instructions  Medication Instructions:   START Rosuvastatin one (1) tablet by mouth ( 20 mg) every evening.   *If you need a refill on your cardiac medications before your next appointment, please call your pharmacy*   Lab Work:  Your physician recommends that you return for a FASTING lipid profile/Alt on  Thursday, July 25. You can come in on the day of your appointment anytime between 7:30-4:30 fasting from midnight the night before.    If you have labs (blood work) drawn today and your tests are completely normal, you will receive your results  only by: MyChart Message (if you have MyChart) OR A paper copy in the mail If you have any lab test that is abnormal or we need to change your treatment, we will call you to review the results.   Testing/Procedures:  None ordered.   Follow-Up: At [facility], you and your health needs are our priority.  As part of our continuing mission to provide you with exceptional heart care, we have created designated Provider Care Teams.  These Care Teams include your primary Cardiologist (physician) and Advanced Practice Providers (APPs -  Physician Assistants and Nurse Practitioners) who all work together to provide you with the care you need, when you need it.  We recommend signing up for the patient portal called "MyChart".  Sign up information is provided on this After Visit Summary.  MyChart is used to connect with patients for Virtual Visits (Telemedicine).  Patients are able to view lab/test results, encounter notes, upcoming appointments, etc.  Non-urgent messages can be sent to your provider as well.   To learn more about what you can do with MyChart, go to [email or web address].    Your next appointment:   6 month(s)  Provider:   Armanda Magic, MD       Signed, Levi Aland, NP  03/26/2023 8:43 AM    [facility]

## 2023-03-25 ENCOUNTER — Ambulatory Visit: Payer: Medicare Other | Attending: Nurse Practitioner | Admitting: Nurse Practitioner

## 2023-03-25 ENCOUNTER — Encounter: Payer: Self-pay | Admitting: Nurse Practitioner

## 2023-03-25 VITALS — BP 130/94 | HR 64 | Ht 63.0 in | Wt 176.0 lb

## 2023-03-25 DIAGNOSIS — I340 Nonrheumatic mitral (valve) insufficiency: Secondary | ICD-10-CM | POA: Insufficient documentation

## 2023-03-25 DIAGNOSIS — R002 Palpitations: Secondary | ICD-10-CM | POA: Diagnosis not present

## 2023-03-25 DIAGNOSIS — I2510 Atherosclerotic heart disease of native coronary artery without angina pectoris: Secondary | ICD-10-CM | POA: Insufficient documentation

## 2023-03-25 DIAGNOSIS — R42 Dizziness and giddiness: Secondary | ICD-10-CM | POA: Insufficient documentation

## 2023-03-25 DIAGNOSIS — R0602 Shortness of breath: Secondary | ICD-10-CM | POA: Insufficient documentation

## 2023-03-25 DIAGNOSIS — I10 Essential (primary) hypertension: Secondary | ICD-10-CM | POA: Diagnosis not present

## 2023-03-25 DIAGNOSIS — I493 Ventricular premature depolarization: Secondary | ICD-10-CM | POA: Diagnosis not present

## 2023-03-25 DIAGNOSIS — E785 Hyperlipidemia, unspecified: Secondary | ICD-10-CM | POA: Insufficient documentation

## 2023-03-25 MED ORDER — ROSUVASTATIN CALCIUM 20 MG PO TABS: 20.0000 mg | ORAL_TABLET | Freq: Every evening | ORAL | 3 refills | Status: DC

## 2023-03-25 MED ORDER — METOPROLOL SUCCINATE ER 25 MG PO TB24: 12.5000 mg | ORAL_TABLET | Freq: Every day | ORAL | 3 refills | Status: DC

## 2023-03-25 NOTE — Patient Instructions (Signed)
Medication Instructions:   START Rosuvastatin one (1) tablet by mouth ( 20 mg) every evening.   *If you need a refill on your cardiac medications before your next appointment, please call your pharmacy*   Lab Work:  Your physician recommends that you return for a FASTING lipid profile/Alt on  Thursday, July 25. You can come in on the day of your appointment anytime between 7:30-4:30 fasting from midnight the night before.    If you have labs (blood work) drawn today and your tests are completely normal, you will receive your results only by: MyChart Message (if you have MyChart) OR A paper copy in the mail If you have any lab test that is abnormal or we need to change your treatment, we will call you to review the results.   Testing/Procedures:  None ordered.   Follow-Up: At [facility], you and your health needs are our priority.  As part of our continuing mission to provide you with exceptional heart care, we have created designated Provider Care Teams.  These Care Teams include your primary Cardiologist (physician) and Advanced Practice Providers (APPs -  Physician Assistants and Nurse Practitioners) who all work together to provide you with the care you need, when you need it.  We recommend signing up for the patient portal called "MyChart".  Sign up information is provided on this After Visit Summary.  MyChart is used to connect with patients for Virtual Visits (Telemedicine).  Patients are able to view lab/test results, encounter notes, upcoming appointments, etc.  Non-urgent messages can be sent to your provider as well.   To learn more about what you can do with MyChart, go to [email or web address].    Your next appointment:   6 month(s)  Provider:   Armanda Magic, MD

## 2023-03-26 ENCOUNTER — Encounter: Payer: Self-pay | Admitting: Nurse Practitioner

## 2023-05-07 ENCOUNTER — Telehealth: Payer: Self-pay | Admitting: *Deleted

## 2023-05-07 NOTE — Telephone Encounter (Signed)
  Patient Consent for Virtual Visit        Cathy Tucker has provided verbal consent on 05/07/2023 for a virtual visit (video or telephone).   CONSENT FOR VIRTUAL VISIT FOR:  Cathy Tucker  By participating in this virtual visit I agree to the following:  I hereby voluntarily request, consent and authorize [facility] and its employed or contracted physicians, physician assistants, nurse practitioners or other licensed health care professionals (the Practitioner), to provide me with telemedicine health care services (the "Services") as deemed necessary by the treating Practitioner. I acknowledge and consent to receive the Services by the Practitioner via telemedicine. I understand that the telemedicine visit will involve communicating with the Practitioner through live audiovisual communication technology and the disclosure of certain medical information by electronic transmission. I acknowledge that I have been given the opportunity to request an in-person assessment or other available alternative prior to the telemedicine visit and am voluntarily participating in the telemedicine visit.  I understand that I have the right to withhold or withdraw my consent to the use of telemedicine in the course of my care at any time, without affecting my right to future care or treatment, and that the Practitioner or I may terminate the telemedicine visit at any time. I understand that I have the right to inspect all information obtained and/or recorded in the course of the telemedicine visit and may receive copies of available information for a reasonable fee.  I understand that some of the potential risks of receiving the Services via telemedicine include:  Delay or interruption in medical evaluation due to technological equipment failure or disruption; Information transmitted may not be sufficient (e.g. poor resolution of images) to allow for appropriate medical decision making by the  Practitioner; and/or  In rare instances, security protocols could fail, causing a breach of personal health information.  Furthermore, I acknowledge that it is my responsibility to provide information about my medical history, conditions and care that is complete and accurate to the best of my ability. I acknowledge that Practitioner's advice, recommendations, and/or decision may be based on factors not within their control, such as incomplete or inaccurate data provided by me or distortions of diagnostic images or specimens that may result from electronic transmissions. I understand that the practice of medicine is not an exact science and that Practitioner makes no warranties or guarantees regarding treatment outcomes. I acknowledge that a copy of this consent can be made available to me via my patient portal [facility] MyChart), or I can request a printed copy by calling the office of [facility].    I understand that my insurance will be billed for this visit.   I have read or had this consent read to me. I understand the contents of this consent, which adequately explains the benefits and risks of the Services being provided via telemedicine.  I have been provided ample opportunity to ask questions regarding this consent and the Services and have had my questions answered to my satisfaction. I give my informed consent for the services to be provided through the use of telemedicine in my medical care

## 2023-05-07 NOTE — Telephone Encounter (Signed)
MED REC AND CONSENT DONE PATIENT SCHEDULED 05-11-23

## 2023-05-07 NOTE — Telephone Encounter (Signed)
   Pre-operative Risk Assessment    Patient Name: Cathy Tucker  DOB: Aug 31, 1946 MRN: [id number]      Request for Surgical Clearance    Procedure:   COLONOSCOPY; SCREENING  Date of Surgery:  Clearance 06/08/23                                 Surgeon:  DR. [facility] Surgeon's Group or Practice Name:  [facility] Phone number:  [phone] Fax number:  [phone]   Type of Clearance Requested:   - Medical ; NO MEDICATIONS LISTED TO BE HELD   Type of Anesthesia:   PROPOFOL   Additional requests/questions:    Elpidio Anis   05/07/2023, 1:01 PM

## 2023-05-07 NOTE — Telephone Encounter (Signed)
   Name: Cathy Tucker  DOB: Oct 25, 1946  MRN: [id number]  Primary Cardiologist: Armanda Magic, MD   Preoperative team, please contact this patient and set up a phone call appointment for further preoperative risk assessment. Please obtain consent and complete medication review. Thank you for your help.  I confirm that guidance regarding antiplatelet and oral anticoagulation therapy has been completed and, if necessary, noted below.  None requested.    Carlos Levering, NP 05/07/2023, 1:32 PM [facility]

## 2023-05-08 NOTE — Progress Notes (Unsigned)
Virtual Visit via Telephone Note   Because of Cathy Tucker's co-morbid illnesses, Cathy Tucker is at least at moderate risk for complications without adequate follow up.  This format is felt to be most appropriate for this Tucker at this time.  Cathy Tucker did not have access to video technology/had technical difficulties with video requiring transitioning to audio format only (telephone).  All issues noted in this document were discussed and addressed.  No physical exam could be performed with this format.  Please refer to Cathy Tucker's chart for her consent to telehealth for [facility].  Evaluation Performed:  Preoperative cardiovascular risk assessment _____________   Date:  05/08/2023   Tucker ID:  Cathy Tucker, Cathy Tucker 06-15-1946, MRN [id number] Tucker Location:  Home Provider location:   Office  Primary Care Provider:  Alysia Penna, MD Primary Cardiologist:  Armanda Magic, MD  Chief Complaint / Tucker Profile   77 y.o. y/o female with a h/o CAD, PVCs/PAT, hyperlipidemia who is pending colonoscopy by Dr. Marca Ancona and presents today for telephonic preoperative cardiovascular risk assessment.  History of Present Illness    Cathy Tucker is a 77 y.o. female who presents via audio/video conferencing for a telehealth visit today.  Pt was last seen in cardiology clinic on 03/25/2023 by Eligha Bridegroom, NP.  At that time [facility] complaint of DOE when walking up inclines.  Repeat echo was discussed however Tucker wanted to continue to monitor.  Cathy Tucker is now pending procedure as outlined above. Since her last visit, Cathy Tucker is doing well from a cardiac perspective. Tucker denies shortness of breath. Cathy Tucker has mild dyspnea with heavy exertion such as walking up a steep incline that requires her to rest for a moment and then Cathy Tucker is able to resume her activity. No chest pain, pressure, or tightness. Denies lower extremity edema, orthopnea, or PND. No palpitations.   Cathy Tucker is very active walking 1.5-2 miles 6 days a week and water aerobics 3 days a week.   Past Medical History    Past Medical History:  Diagnosis Date   Allergic rhinitis    Athlete's foot    CAD (coronary artery disease), native coronary artery    Coronary CTA with coronary calcium score 0 and less than 25% LM plaque in 2022   COVID    Fracture of humerus, proximal, right, closed 11/22/2015   HLD (hyperlipidemia)    Knee pain    Migraines    Osteopenia    Palpitations    PAT (paroxysmal atrial tachycardia)    Noted on event monitor 2022   PVCs (premature ventricular contractions)    Noted on event monitor 2022   Situational anxiety    Past Surgical History:  Procedure Laterality Date   ABDOMINAL HYSTERECTOMY     CARPAL TUNNEL WITH CUBITAL TUNNEL     HAND SURGERY     ORIF HUMERUS FRACTURE Right 11/22/2015   Procedure: OPEN REDUCTION INTERNAL FIXATION (ORIF) RIGHT PROXIMAL HUMERUS FRACTURE;  Surgeon: Teryl Lucy, MD;  Location: [facility];  Service: Orthopedics;  Laterality: Right;   ulnar nerve decompression      Allergies  Allergies  Allergen Reactions   Tetracyclines & Related Hives   Penicillins Rash    Home Medications    Prior to Admission medications   Medication Sig Start Date End Date Taking? Authorizing Provider  albuterol (VENTOLIN HFA) 108 (90 Base) MCG/ACT inhaler Inhale 2 puffs into Cathy lungs every 4 (four) hours  as needed for shortness of breath or wheezing.    [provider]  Calcium Carb-Cholecalciferol (CALCIUM 1000 + D) 1000-20 MG-MCG TABS daily. 10/20/16   [provider]  Cyanocobalamin (B-12) 1000 MCG CAPS daily. 10/20/16   [provider]  estradiol (ESTRACE) 0.5 MG tablet Take 0.5 mg by mouth daily.    [provider]  fexofenadine (ALLEGRA) 180 MG tablet Take 180 mg by mouth daily.    [provider]  fluticasone (FLONASE) 50 MCG/ACT nasal spray Place 2 sprays into both nostrils  daily.    [provider]  metoprolol succinate (TOPROL-XL) 25 MG 24 hr tablet Take 0.5 tablets (12.5 mg total) by mouth daily. 03/25/23   Swinyer, Zachary George, NP  montelukast (SINGULAIR) 10 MG tablet Take 10 mg by mouth at bedtime.    [provider]  rosuvastatin (CRESTOR) 20 MG tablet Take 1 tablet (20 mg total) by mouth every evening. 03/25/23 06/23/23  Swinyer, Zachary George, NP    Physical Exam    Vital Signs:  Pricilla Handler does not have vital signs available for review today.  Given telephonic nature of communication, physical exam is limited. AAOx3. NAD. Normal affect.  Speech and respirations are unlabored.  Accessory Clinical Findings    None  Assessment & Plan    Primary Cardiologist: Armanda Magic, MD  Preoperative cardiovascular risk assessment.  Colonoscopy by Dr. Marca Ancona.  Chart reviewed as part of pre-operative protocol coverage. According to Cathy RCRI, Tucker has a 0.4% risk of MACE. Tucker reports activity equivalent to >4.0 METS (walks 1.5-2 miles 6 days a week, water aerobics 3 days a week).   Given past medical history and time since last visit, based on ACC/AHA guidelines, Earl Havice would be at acceptable risk for Cathy planned procedure without further cardiovascular testing.   Tucker was advised that if Cathy Tucker develops new symptoms prior to surgery to contact our office to arrange a follow-up appointment.  Cathy Tucker verbalized understanding.   I will route this recommendation to Cathy requesting party via Epic fax function.  Please call with questions.  Time:   Today, I have spent 5 minutes with Cathy Tucker with telehealth technology discussing medical history, symptoms, and management plan.     Carlos Levering, NP  05/08/2023, 2:44 PM

## 2023-05-11 ENCOUNTER — Ambulatory Visit: Payer: Medicare Other | Attending: Cardiology | Admitting: Student

## 2023-05-11 DIAGNOSIS — Z01810 Encounter for preprocedural cardiovascular examination: Secondary | ICD-10-CM

## 2023-05-27 ENCOUNTER — Ambulatory Visit: Payer: Medicare Other | Attending: Nurse Practitioner

## 2023-05-27 DIAGNOSIS — I2510 Atherosclerotic heart disease of native coronary artery without angina pectoris: Secondary | ICD-10-CM | POA: Diagnosis not present

## 2023-05-27 DIAGNOSIS — I493 Ventricular premature depolarization: Secondary | ICD-10-CM

## 2023-05-27 DIAGNOSIS — E785 Hyperlipidemia, unspecified: Secondary | ICD-10-CM

## 2023-06-08 DIAGNOSIS — D122 Benign neoplasm of ascending colon: Secondary | ICD-10-CM | POA: Diagnosis not present

## 2023-06-08 DIAGNOSIS — D120 Benign neoplasm of cecum: Secondary | ICD-10-CM | POA: Diagnosis not present

## 2023-06-08 DIAGNOSIS — K644 Residual hemorrhoidal skin tags: Secondary | ICD-10-CM | POA: Diagnosis not present

## 2023-06-08 DIAGNOSIS — Z1211 Encounter for screening for malignant neoplasm of colon: Secondary | ICD-10-CM | POA: Diagnosis not present

## 2023-06-08 DIAGNOSIS — K648 Other hemorrhoids: Secondary | ICD-10-CM | POA: Diagnosis not present

## 2023-06-10 DIAGNOSIS — D122 Benign neoplasm of ascending colon: Secondary | ICD-10-CM | POA: Diagnosis not present

## 2023-07-23 DIAGNOSIS — H353131 Nonexudative age-related macular degeneration, bilateral, early dry stage: Secondary | ICD-10-CM | POA: Diagnosis not present

## 2023-07-23 DIAGNOSIS — H5213 Myopia, bilateral: Secondary | ICD-10-CM | POA: Diagnosis not present

## 2023-07-23 DIAGNOSIS — H2513 Age-related nuclear cataract, bilateral: Secondary | ICD-10-CM | POA: Diagnosis not present

## 2023-08-30 DIAGNOSIS — H9202 Otalgia, left ear: Secondary | ICD-10-CM | POA: Diagnosis not present

## 2023-08-30 DIAGNOSIS — R058 Other specified cough: Secondary | ICD-10-CM | POA: Diagnosis not present

## 2023-08-30 DIAGNOSIS — J209 Acute bronchitis, unspecified: Secondary | ICD-10-CM | POA: Diagnosis not present

## 2023-08-30 DIAGNOSIS — J3089 Other allergic rhinitis: Secondary | ICD-10-CM | POA: Diagnosis not present

## 2023-09-13 ENCOUNTER — Ambulatory Visit: Payer: Medicare Other | Admitting: Cardiology

## 2023-09-15 ENCOUNTER — Ambulatory Visit: Payer: Medicare Other | Attending: Cardiology | Admitting: Cardiology

## 2023-09-15 ENCOUNTER — Encounter: Payer: Self-pay | Admitting: Cardiology

## 2023-09-15 VITALS — BP 130/80 | HR 62 | Ht 63.0 in | Wt 181.0 lb

## 2023-09-15 DIAGNOSIS — E785 Hyperlipidemia, unspecified: Secondary | ICD-10-CM | POA: Insufficient documentation

## 2023-09-15 DIAGNOSIS — R002 Palpitations: Secondary | ICD-10-CM | POA: Diagnosis not present

## 2023-09-15 DIAGNOSIS — I340 Nonrheumatic mitral (valve) insufficiency: Secondary | ICD-10-CM | POA: Insufficient documentation

## 2023-09-15 DIAGNOSIS — R42 Dizziness and giddiness: Secondary | ICD-10-CM | POA: Diagnosis not present

## 2023-09-15 DIAGNOSIS — R0602 Shortness of breath: Secondary | ICD-10-CM | POA: Diagnosis not present

## 2023-09-15 NOTE — Progress Notes (Signed)
Cardiology Office Note:   Date:  09/15/2023  ID:  Cathy Tucker, Cathy Tucker Aug 09, 1946, MRN [id number] PCP: Alysia Penna, MD  [facility] Providers Cardiologist:  Armanda Magic, MD    History of Present Illness:   Discussed the use of AI scribe software for clinical note transcription with the patient, who gave verbal consent to proceed.  History of Present Illness   The patient, a 77 year old with a history of palpitations, migraines, hyperlipidemia, anxiety, and mild mitral regurgitation, was initially evaluated for palpitations and dyspnea on exertion in 2019. She reported episodes of rapid heart rate lasting 2-3 minutes, occurring up to three times per week. An echocardiogram revealed normal left ventricular ejection fraction and no significant valvular disease. A heart monitor revealed sinus rhythm with palpitations correlating with nonsustained atrial tachycardia runs, with no evidence of atrial fibrillation or flutter.  In 2022, the patient reported worsening palpitations and new onset chest pain, both at rest and on exertion. A coronary CTA revealed a calcium score of zero with less than 25% left main plaque. A repeat heart monitor showed sinus rhythm with nonsustained atrial tachycardia lasting up to 12 seconds with occasional PACs, PBCs. A repeat echocardiogram showed normal left ventricular ejection fraction with mild mitral regurgitation.  As of the last office visit in May 2024, the patient reported improvement, walking up to two miles per day and participating in water aerobics. Reported some dyspnea when walking up hills.  Today patient reports she still experiences occasional dizziness and palpitations, occurring once a week to every ten days, but not daily. The intensity of these symptoms has decreased since her onset. The patient also experiences shortness of breath when walking uphill, requiring her to stop and catch her breath before continuing. She continues to  participate in water aerobics three days a week without experiencing shortness of breath. The patient walks about two miles five days a week.  The patient acknowledges being about 30 pounds overweight and struggles with a sweet tooth, leading to inconsistent dietary habits. She previously lost weight on Contrave, but stopped due to stomach upset. The patient has a plan to increase exercise, including using a home gym setup. Despite the weight concerns, the patient's LDL was 62 as of July, indicating effective cholesterol management with Crestor 20 mg taken every other day. The patient reported no shortness of breath at night and no leg swelling, except when traveling. She has not experienced any falls recently and feels her balance is good.      Studies Reviewed:    Cardiac Studies & Procedures       ECHOCARDIOGRAM  ECHOCARDIOGRAM COMPLETE 10/10/2021  Narrative ECHOCARDIOGRAM REPORT    Patient Name:   Cathy Tucker [facility] Date of Exam: 10/10/2021 Medical Rec #:  [id number]           Height:       63.0 in Accession #:    [id number]          Weight:       177.0 lb Date of Birth:  [birth year]           BSA:          1.836 m Patient Age:    75 years            BP:           126/49 mmHg Patient Gender: F                   HR:  56 bpm. Exam Location:  [address]  Procedure: 2D Echo and 3D Echo  Indications:    I49.3 PVC  History:        Patient has prior history of Echocardiogram examinations, most recent 01/04/2018.  Sonographer:    Thurman Coyer RDCS Referring Phys: [phone] TRACI R TURNER  IMPRESSIONS   1. Left ventricular ejection fraction, by estimation, is 55 to 60%. The left ventricle has normal function. The left ventricle has no regional wall motion abnormalities. Left ventricular diastolic parameters are indeterminate. 2. Right ventricular systolic function is normal. The right ventricular size is normal. There is normal pulmonary artery systolic pressure. 3. The  mitral valve is normal in structure. Mild mitral valve regurgitation. 4. The aortic valve is tricuspid. Aortic valve regurgitation is trivial. Aortic valve sclerosis is present, with no evidence of aortic valve stenosis.  FINDINGS Left Ventricle: Left ventricular ejection fraction, by estimation, is 55 to 60%. The left ventricle has normal function. The left ventricle has no regional wall motion abnormalities. The left ventricular internal cavity size was normal in size. There is no left ventricular hypertrophy. Left ventricular diastolic parameters are indeterminate.  Right Ventricle: The right ventricular size is normal. Right vetricular wall thickness was not assessed. Right ventricular systolic function is normal. There is normal pulmonary artery systolic pressure. The tricuspid regurgitant velocity is 2.42 m/s, and with an assumed right atrial pressure of 3 mmHg, the estimated right ventricular systolic pressure is 26.4 mmHg.  Left Atrium: Left atrial size was normal in size.  Right Atrium: Right atrial size was normal in size.  Pericardium: There is no evidence of pericardial effusion.  Mitral Valve: The mitral valve is normal in structure. Mild mitral valve regurgitation.  Tricuspid Valve: The tricuspid valve is normal in structure. Tricuspid valve regurgitation is mild.  Aortic Valve: The aortic valve is tricuspid. Aortic valve regurgitation is trivial. Aortic valve sclerosis is present, with no evidence of aortic valve stenosis.  Pulmonic Valve: The pulmonic valve was normal in structure. Pulmonic valve regurgitation is not visualized.  Aorta: The aortic root and ascending aorta are structurally normal, with no evidence of dilitation.  IAS/Shunts: No atrial level shunt detected by color flow Doppler.   LEFT VENTRICLE PLAX 2D LVIDd:         4.10 cm   Diastology LVIDs:         2.50 cm   LV e' medial:    6.00 cm/s LV PW:         0.80 cm   LV E/e' medial:  16.4 LV IVS:         0.70 cm   LV e' lateral:   9.16 cm/s LVOT diam:     2.00 cm   LV E/e' lateral: 10.8 LV SV:         90 LV SV Index:   49 LVOT Area:     3.14 cm  3D Volume EF: 3D EF:        57 % LV EDV:       112 ml LV ESV:       48 ml LV SV:        64 ml  RIGHT VENTRICLE RV S prime:     11.40 cm/s TAPSE (M-mode): 1.7 cm  LEFT ATRIUM             Index        RIGHT ATRIUM           Index LA diam:  3.20 cm 1.74 cm/m   RA Area:     11.80 cm LA Vol (A2C):   52.9 ml 28.82 ml/m  RA Volume:   24.70 ml  13.45 ml/m LA Vol (A4C):   51.0 ml 27.78 ml/m LA Biplane Vol: 51.7 ml 28.16 ml/m AORTIC VALVE LVOT Vmax:   123.00 cm/s LVOT Vmean:  76.800 cm/s LVOT VTI:    0.286 m  AORTA Ao Root diam: 2.90 cm  MITRAL VALVE                TRICUSPID VALVE MV Area (PHT): 3.60 cm     TR Peak grad:   23.4 mmHg MV Decel Time: 211 msec     TR Vmax:        242.00 cm/s MV E velocity: 98.50 cm/s MV A velocity: 101.00 cm/s  SHUNTS MV E/A ratio:  0.98         Systemic VTI:  0.29 m Systemic Diam: 2.00 cm  Dietrich Pates MD Electronically signed by Dietrich Pates MD Signature Date/Time: 10/10/2021/12:33:41 PM    Final    MONITORS  LONG TERM MONITOR (3-14 DAYS) 09/17/2021  Narrative  Predominant rhythm is normal sinus rhythm with an average heart rate of 72 bpm and ranged from 49 to 190 bpm.  Nonsustained atrial tachycardia up to 12.3 seconds in duration with fastest heart rate 190 bpm. There were also occasional PACs, atrial couplets and atrial triplets.  Occasional PVCs and rare ventricular couplets. There was also ventricular bigeminy and trigeminy present with PVC load less than 1%   Patch Wear Time:  14 days and 0 hours (2022-10-26T06:45:35-0400 to 2022-11-09T05:45:39-0500)  Patient had a min HR of 49 bpm, max HR of 190 bpm, and avg HR of 72 bpm. Predominant underlying rhythm was Sinus Rhythm. 11 Supraventricular Tachycardia runs occurred, the run with the fastest interval lasting 7 beats with a max  rate of 190 bpm, the longest lasting 12.3 secs with an avg rate of 111 bpm. Supraventricular Tachycardia was detected within +/- 45 seconds of symptomatic patient event(s). Isolated SVEs were rare (<1.0%), SVE Couplets were rare (<1.0%), and SVE Triplets were rare (<1.0%). Isolated VEs were rare (<1.0%), VE Couplets were rare (<1.0%), and no VE Triplets were present. Ventricular Bigeminy and Trigeminy were present.   CT SCANS  CT CORONARY MORPH W/CTA COR W/SCORE 08/26/2021  Addendum 09/02/2021  8:19 AM ADDENDUM REPORT: 09/02/2021 08:16  EXAM: OVER-READ INTERPRETATION  CT CHEST  The following report is an over-read performed by radiologist Dr. Marinda Elk [facility] on 09/02/2021. This over-read does not include interpretation of cardiac or coronary anatomy or pathology. The coronary CTA interpretation by the cardiologist is attached.  COMPARISON:  None.  FINDINGS: No significant noncardiac vascular findings. Visualized mediastinum and hilar regions demonstrate no lymphadenopathy or abnormal masses. There is a small hiatal hernia. Visualized lungs show no evidence of pulmonary edema, consolidation, pneumothorax, nodule or pleural fluid. Visualized upper abdomen and bony structures are unremarkable.  IMPRESSION: Small hiatal hernia.   Electronically Signed By: Irish Lack M.D. On: 09/02/2021 08:16  Narrative CLINICAL DATA:  77F with chest pain.  EXAM: Cardiac/Coronary  CT  TECHNIQUE: The patient was scanned on a [employer].  FINDINGS: A 120 kV prospective scan was triggered in the descending thoracic aorta at 111 HU's. Axial non-contrast 3 mm slices were carried out through the heart. The data set was analyzed on a dedicated work station and scored using the Agatson method. Gantry rotation speed was 250  msecs and collimation was .6 mm. No beta blockade and 0.8 mg of sl NTG was given. The 3D data set was reconstructed in 5% intervals  of the 67-82 % of the R-R cycle. Diastolic phases were analyzed on a dedicated work station using MPR, MIP and VRT modes. The patient received 80 cc of contrast.  Aorta: Normal size. Ascending aorta 3.2 cm. No calcifications. No dissection.  Aortic Valve:  Trileaflet.  No calcifications.  Coronary Arteries:  Normal coronary origin.  Right dominance.  RCA is a large dominant artery that gives rise to PDA and two PLV branches. There is no plaque.  Left main is a large artery that gives rise to LAD and LCX arteries.  LAD is a large vessel that has no plaque. There three diagonal vessels without plaque.  LCX is a non-dominant artery that gives rise to one small OM branch. There is minimal plaque in OM1.  Coronary Calcium Score: 0  Other findings:  Normal pulmonary vein drainage into the left atrium.  Normal let atrial appendage without a thrombus.  Normal size of the pulmonary artery.  IMPRESSION: 1. Coronary calcium score of 0. This was 0 percentile for age-, race-, and sex-matched controls.  2. Normal coronary origin with right dominance.  3. There is minimal (<25%) plaque in OM1.  CAD-RADS 1.  4.  Consider non-cardiac causes of chest pain.  Interpretation of the non-cardiac thoracic structures by Radiology is pending.  Chilton Si, MD  Electronically Signed: By: Chilton Si M.D. On: 08/26/2021 14:36           Risk Assessment/Calculations:              Physical Exam:   VS:  BP 130/80 (BP Location: Left Arm, Patient Position: Sitting, Cuff Size: Normal)   Pulse 62   Ht 5\' 3"  (1.6 m)   Wt 181 lb (82.1 kg)   SpO2 95%   BMI 32.06 kg/m    Wt Readings from Last 3 Encounters:  09/15/23 181 lb (82.1 kg)  03/25/23 176 lb (79.8 kg)  01/11/23 179 lb 3.2 oz (81.3 kg)     Physical Exam Vitals reviewed.  Constitutional:      Appearance: Normal appearance.  HENT:     Head: Normocephalic.     Nose: Nose normal.  Eyes:     Pupils: Pupils are  equal, round, and reactive to light.  Cardiovascular:     Rate and Rhythm: Normal rate and regular rhythm.     Pulses: Normal pulses.     Heart sounds: Normal heart sounds. No murmur heard.    No friction rub. No gallop.  Pulmonary:     Effort: Pulmonary effort is normal.     Breath sounds: Normal breath sounds.  Musculoskeletal:     Right lower leg: No edema.     Left lower leg: No edema.  Skin:    General: Skin is warm and dry.     Capillary Refill: Capillary refill takes less than 2 seconds.  Neurological:     General: No focal deficit present.     Mental Status: She is alert and oriented to person, place, and time.  Psychiatric:        Mood and Affect: Mood normal.        Behavior: Behavior normal.        Thought Content: Thought content normal.        Judgment: Judgment normal.     ASSESSMENT AND PLAN:  Assessment and Plan    Palpitations Intermittent palpitations occurring 1-3 times weekly, less intense than initially. Previous heart monitors showed nonsustained atrial tachycardia with occasional PACs and PVCs. Managed effectively with metoprolol succinate 12.5 mg daily. No evidence of atrial fibrillation or flutter. - Continue metoprolol succinate 12.5 mg daily - Monitor for increased frequency or severity of palpitations  Shortness of Breath Shortness of breath primarily when walking uphill, relieved by rest. No symptoms during water aerobics or at night. No significant changes since last visit. No new symptoms such as leg swelling or nocturnal dyspnea. - Monitor for increased frequency or severity of shortness of breath - Report new symptoms such as leg swelling or nocturnal dyspnea  Dizziness Occasional dizziness, occurring weekly to every ten days. No increase in frequency since last visit. Previous carotid artery study was reassuring. Adequate hydration maintained. - Monitor for changes in dizziness - Ensure adequate hydration  Mild Mitral  Regurgitation Mild mitral regurgitation noted on echocardiogram in December 2022. No significant symptoms or changes warranting repeat echocardiogram. No murmur on current exam. Explained that mild valve leaking is common and does not necessitate frequent echocardiograms unless symptoms worsen. - No repeat echocardiogram needed at this time - Monitor for new symptoms such as increased palpitations, swelling, or decreased exercise tolerance  Hyperlipidemia LDL cholesterol well controlled at 62 mg/dL as of July 6578. 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 Currently taking Crestor 20 mg every other day, which has been effective and well-tolerated. Discussed the option of taking 10 mg daily as an alternative. - Continue Crestor 20 mg every other day - Monitor cholesterol levels as needed  General Health Maintenance Active lifestyle with walking two miles five days a week and water aerobics three days a week. Reports a sweet tooth and occasional difficulty with diet control. Weight increased by 5 pounds since last visit. Discussed healthier alternatives for sweet cravings and the importance of diet control. - Encourage continued physical activity - Discuss healthier alternatives for sweet cravings - Monitor weight and diet  Follow-up - Schedule follow-up visit in one year unless symptoms change.                 Signed, Perlie Gold, PA-C

## 2023-09-15 NOTE — Patient Instructions (Signed)
Medication Instructions:  Your physician recommends that you continue on your current medications as directed. Please refer to the Current Medication list given to you today.  *If you need a refill on your cardiac medications before your next appointment, please call your pharmacy*  Lab Work: None ordered today.  Testing/Procedures: None ordered today.  Follow-Up: At [facility], you and your health needs are our priority.  As part of our continuing mission to provide you with exceptional heart care, we have created designated Provider Care Teams.  These Care Teams include your primary Cardiologist (physician) and Advanced Practice Providers (APPs -  Physician Assistants and Nurse Practitioners) who all work together to provide you with the care you need, when you need it.  Your next appointment:   1 year(s)  The format for your next appointment:   In Person  Provider:   Armanda Magic, MD  or Perlie Gold, PA-C

## 2023-11-12 DIAGNOSIS — D225 Melanocytic nevi of trunk: Secondary | ICD-10-CM | POA: Diagnosis not present

## 2023-11-12 DIAGNOSIS — B078 Other viral warts: Secondary | ICD-10-CM | POA: Diagnosis not present

## 2023-11-12 DIAGNOSIS — Z1283 Encounter for screening for malignant neoplasm of skin: Secondary | ICD-10-CM | POA: Diagnosis not present

## 2023-12-30 DIAGNOSIS — R058 Other specified cough: Secondary | ICD-10-CM | POA: Diagnosis not present

## 2023-12-30 DIAGNOSIS — R0981 Nasal congestion: Secondary | ICD-10-CM | POA: Diagnosis not present

## 2023-12-30 DIAGNOSIS — Z1152 Encounter for screening for COVID-19: Secondary | ICD-10-CM | POA: Diagnosis not present

## 2023-12-30 DIAGNOSIS — J111 Influenza due to unidentified influenza virus with other respiratory manifestations: Secondary | ICD-10-CM | POA: Diagnosis not present

## 2023-12-30 DIAGNOSIS — R5383 Other fatigue: Secondary | ICD-10-CM | POA: Diagnosis not present

## 2023-12-30 DIAGNOSIS — G43909 Migraine, unspecified, not intractable, without status migrainosus: Secondary | ICD-10-CM | POA: Diagnosis not present

## 2024-01-10 DIAGNOSIS — R058 Other specified cough: Secondary | ICD-10-CM | POA: Diagnosis not present

## 2024-01-10 DIAGNOSIS — J111 Influenza due to unidentified influenza virus with other respiratory manifestations: Secondary | ICD-10-CM | POA: Diagnosis not present

## 2024-01-10 DIAGNOSIS — R0609 Other forms of dyspnea: Secondary | ICD-10-CM | POA: Diagnosis not present

## 2024-02-03 ENCOUNTER — Other Ambulatory Visit: Payer: Self-pay | Admitting: Internal Medicine

## 2024-02-03 DIAGNOSIS — Z1231 Encounter for screening mammogram for malignant neoplasm of breast: Secondary | ICD-10-CM

## 2024-02-08 ENCOUNTER — Ambulatory Visit
Admission: RE | Admit: 2024-02-08 | Discharge: 2024-02-08 | Disposition: A | Discharge status: Home / Self Care | Source: Ambulatory Visit | Attending: Internal Medicine | Admitting: Internal Medicine

## 2024-02-08 DIAGNOSIS — Z1231 Encounter for screening mammogram for malignant neoplasm of breast: Secondary | ICD-10-CM | POA: Diagnosis not present

## 2024-03-13 ENCOUNTER — Other Ambulatory Visit: Payer: Self-pay | Admitting: Student

## 2024-03-13 DIAGNOSIS — R109 Unspecified abdominal pain: Secondary | ICD-10-CM

## 2024-03-13 DIAGNOSIS — K219 Gastro-esophageal reflux disease without esophagitis: Secondary | ICD-10-CM | POA: Diagnosis not present

## 2024-03-16 ENCOUNTER — Other Ambulatory Visit: Payer: Self-pay | Admitting: Nurse Practitioner

## 2024-03-20 ENCOUNTER — Ambulatory Visit
Admission: RE | Admit: 2024-03-20 | Discharge: 2024-03-20 | Disposition: A | Discharge status: Home / Self Care | Source: Ambulatory Visit | Attending: Student | Admitting: Student

## 2024-03-20 DIAGNOSIS — M810 Age-related osteoporosis without current pathological fracture: Secondary | ICD-10-CM | POA: Diagnosis not present

## 2024-03-20 DIAGNOSIS — E785 Hyperlipidemia, unspecified: Secondary | ICD-10-CM | POA: Diagnosis not present

## 2024-03-20 DIAGNOSIS — R109 Unspecified abdominal pain: Secondary | ICD-10-CM

## 2024-03-20 DIAGNOSIS — R7989 Other specified abnormal findings of blood chemistry: Secondary | ICD-10-CM | POA: Diagnosis not present

## 2024-03-20 DIAGNOSIS — R1011 Right upper quadrant pain: Secondary | ICD-10-CM | POA: Diagnosis not present

## 2024-04-04 DIAGNOSIS — R0602 Shortness of breath: Secondary | ICD-10-CM | POA: Diagnosis not present

## 2024-04-04 DIAGNOSIS — N951 Menopausal and female climacteric states: Secondary | ICD-10-CM | POA: Diagnosis not present

## 2024-04-04 DIAGNOSIS — Z1331 Encounter for screening for depression: Secondary | ICD-10-CM | POA: Diagnosis not present

## 2024-04-04 DIAGNOSIS — I340 Nonrheumatic mitral (valve) insufficiency: Secondary | ICD-10-CM | POA: Diagnosis not present

## 2024-04-04 DIAGNOSIS — R002 Palpitations: Secondary | ICD-10-CM | POA: Diagnosis not present

## 2024-04-04 DIAGNOSIS — M810 Age-related osteoporosis without current pathological fracture: Secondary | ICD-10-CM | POA: Diagnosis not present

## 2024-04-04 DIAGNOSIS — E669 Obesity, unspecified: Secondary | ICD-10-CM | POA: Diagnosis not present

## 2024-04-04 DIAGNOSIS — Z1339 Encounter for screening examination for other mental health and behavioral disorders: Secondary | ICD-10-CM | POA: Diagnosis not present

## 2024-04-04 DIAGNOSIS — R1013 Epigastric pain: Secondary | ICD-10-CM | POA: Diagnosis not present

## 2024-04-04 DIAGNOSIS — Z23 Encounter for immunization: Secondary | ICD-10-CM | POA: Diagnosis not present

## 2024-04-04 DIAGNOSIS — Z0000 Encounter for general adult medical examination without abnormal findings: Secondary | ICD-10-CM | POA: Diagnosis not present

## 2024-04-04 DIAGNOSIS — E785 Hyperlipidemia, unspecified: Secondary | ICD-10-CM | POA: Diagnosis not present

## 2024-05-16 DIAGNOSIS — R109 Unspecified abdominal pain: Secondary | ICD-10-CM | POA: Diagnosis not present

## 2024-05-16 DIAGNOSIS — K219 Gastro-esophageal reflux disease without esophagitis: Secondary | ICD-10-CM | POA: Diagnosis not present

## 2024-05-17 DIAGNOSIS — H9203 Otalgia, bilateral: Secondary | ICD-10-CM | POA: Diagnosis not present

## 2024-05-17 DIAGNOSIS — R0981 Nasal congestion: Secondary | ICD-10-CM | POA: Diagnosis not present

## 2024-05-17 DIAGNOSIS — Z1152 Encounter for screening for COVID-19: Secondary | ICD-10-CM | POA: Diagnosis not present

## 2024-05-17 DIAGNOSIS — R058 Other specified cough: Secondary | ICD-10-CM | POA: Diagnosis not present

## 2024-05-17 DIAGNOSIS — J3089 Other allergic rhinitis: Secondary | ICD-10-CM | POA: Diagnosis not present

## 2024-05-17 DIAGNOSIS — R0602 Shortness of breath: Secondary | ICD-10-CM | POA: Diagnosis not present

## 2024-05-17 DIAGNOSIS — R053 Chronic cough: Secondary | ICD-10-CM | POA: Diagnosis not present

## 2024-05-17 DIAGNOSIS — R5383 Other fatigue: Secondary | ICD-10-CM | POA: Diagnosis not present

## 2024-06-07 ENCOUNTER — Other Ambulatory Visit: Payer: Self-pay | Admitting: Nurse Practitioner

## 2024-07-18 DIAGNOSIS — R053 Chronic cough: Secondary | ICD-10-CM | POA: Diagnosis not present

## 2024-07-18 DIAGNOSIS — J301 Allergic rhinitis due to pollen: Secondary | ICD-10-CM | POA: Diagnosis not present

## 2024-07-31 DIAGNOSIS — H5213 Myopia, bilateral: Secondary | ICD-10-CM | POA: Diagnosis not present

## 2024-07-31 DIAGNOSIS — H353131 Nonexudative age-related macular degeneration, bilateral, early dry stage: Secondary | ICD-10-CM | POA: Diagnosis not present

## 2024-07-31 DIAGNOSIS — H2513 Age-related nuclear cataract, bilateral: Secondary | ICD-10-CM | POA: Diagnosis not present

## 2024-08-16 DIAGNOSIS — R140 Abdominal distension (gaseous): Secondary | ICD-10-CM | POA: Diagnosis not present

## 2024-08-16 DIAGNOSIS — K219 Gastro-esophageal reflux disease without esophagitis: Secondary | ICD-10-CM | POA: Diagnosis not present

## 2024-08-24 DIAGNOSIS — R053 Chronic cough: Secondary | ICD-10-CM | POA: Diagnosis not present

## 2024-08-24 DIAGNOSIS — Z23 Encounter for immunization: Secondary | ICD-10-CM | POA: Diagnosis not present

## 2024-08-24 DIAGNOSIS — B370 Candidal stomatitis: Secondary | ICD-10-CM | POA: Diagnosis not present

## 2024-09-15 DIAGNOSIS — Z1152 Encounter for screening for COVID-19: Secondary | ICD-10-CM | POA: Diagnosis not present

## 2024-09-15 DIAGNOSIS — R051 Acute cough: Secondary | ICD-10-CM | POA: Diagnosis not present

## 2024-09-15 DIAGNOSIS — R0602 Shortness of breath: Secondary | ICD-10-CM | POA: Diagnosis not present

## 2024-09-15 DIAGNOSIS — J029 Acute pharyngitis, unspecified: Secondary | ICD-10-CM | POA: Diagnosis not present

## 2024-09-15 DIAGNOSIS — R0981 Nasal congestion: Secondary | ICD-10-CM | POA: Diagnosis not present

## 2024-09-15 DIAGNOSIS — R5383 Other fatigue: Secondary | ICD-10-CM | POA: Diagnosis not present

## 2024-09-15 DIAGNOSIS — J069 Acute upper respiratory infection, unspecified: Secondary | ICD-10-CM | POA: Diagnosis not present

## 2024-09-15 DIAGNOSIS — J3489 Other specified disorders of nose and nasal sinuses: Secondary | ICD-10-CM | POA: Diagnosis not present

## 2024-09-17 ENCOUNTER — Other Ambulatory Visit: Payer: Self-pay | Admitting: Cardiology

## 2024-09-20 ENCOUNTER — Other Ambulatory Visit: Payer: Self-pay | Admitting: Cardiology
